# Patient Record
Sex: Female | Born: 1960 | Race: Black or African American | Hispanic: No | Marital: Single | Smoking: Current every day smoker
Health system: Southern US, Community
[De-identification: ages and names within clinical notes are randomized; demographics above are authoritative.]

---

## 2006-07-23 ENCOUNTER — Emergency Department: Payer: Self-pay | Admitting: Emergency Medicine

## 2006-07-23 ENCOUNTER — Other Ambulatory Visit: Payer: Self-pay

## 2006-10-19 ENCOUNTER — Emergency Department: Payer: Self-pay | Admitting: Emergency Medicine

## 2006-10-20 ENCOUNTER — Other Ambulatory Visit: Payer: Self-pay

## 2008-05-01 ENCOUNTER — Emergency Department: Payer: Self-pay | Admitting: Emergency Medicine

## 2008-05-02 ENCOUNTER — Other Ambulatory Visit: Payer: Self-pay

## 2009-07-22 ENCOUNTER — Emergency Department: Payer: Self-pay | Admitting: Emergency Medicine

## 2010-07-03 ENCOUNTER — Emergency Department: Payer: Self-pay | Admitting: Internal Medicine

## 2012-03-05 ENCOUNTER — Emergency Department: Payer: Self-pay | Admitting: Emergency Medicine

## 2012-03-05 LAB — URINALYSIS, COMPLETE
Bilirubin,UR: NEGATIVE
Blood: NEGATIVE
Ketone: NEGATIVE
Ph: 6 (ref 4.5–8.0)
RBC,UR: 2 /HPF (ref 0–5)
Specific Gravity: 1.021 (ref 1.003–1.030)
WBC UR: 2 /HPF (ref 0–5)

## 2012-03-05 LAB — COMPREHENSIVE METABOLIC PANEL
Albumin: 4.1 g/dL (ref 3.4–5.0)
Alkaline Phosphatase: 103 U/L (ref 50–136)
Anion Gap: 9 (ref 7–16)
Bilirubin,Total: 0.3 mg/dL (ref 0.2–1.0)
Chloride: 104 mmol/L (ref 98–107)
Creatinine: 0.75 mg/dL (ref 0.60–1.30)
EGFR (African American): >60
Glucose: 118 mg/dL — ABNORMAL HIGH (ref 65–99)
Osmolality: 281 (ref 275–301)
SGOT(AST): 18 U/L (ref 15–37)
SGPT (ALT): 14 U/L
Sodium: 141 mmol/L (ref 136–145)
Total Protein: 8.9 g/dL — ABNORMAL HIGH (ref 6.4–8.2)

## 2012-03-05 LAB — LIPASE, BLOOD: Lipase: 199 U/L (ref 73–393)

## 2012-03-05 LAB — CBC
HGB: 12.7 g/dL (ref 12.0–16.0)
MCH: 25.6 pg — ABNORMAL LOW (ref 26.0–34.0)
MCV: 79 fL — ABNORMAL LOW (ref 80–100)
Platelet: 310 10*3/uL (ref 150–440)

## 2012-09-22 ENCOUNTER — Emergency Department: Payer: Self-pay | Admitting: Emergency Medicine

## 2012-09-22 LAB — CBC
HGB: 12.6 g/dL (ref 12.0–16.0)
MCH: 24.9 pg — ABNORMAL LOW (ref 26.0–34.0)
MCHC: 32.2 g/dL (ref 32.0–36.0)
MCV: 77 fL — ABNORMAL LOW (ref 80–100)
Platelet: 300 10*3/uL (ref 150–440)
RBC: 5.07 10*6/uL (ref 3.80–5.20)

## 2012-09-22 LAB — COMPREHENSIVE METABOLIC PANEL
Albumin: 4.2 g/dL (ref 3.4–5.0)
Alkaline Phosphatase: 114 U/L (ref 50–136)
BUN: 11 mg/dL (ref 7–18)
Bilirubin,Total: 0.4 mg/dL (ref 0.2–1.0)
Chloride: 106 mmol/L (ref 98–107)
Co2: 25 mmol/L (ref 21–32)
EGFR (Non-African Amer.): >60
Glucose: 127 mg/dL — ABNORMAL HIGH (ref 65–99)
Osmolality: 277 (ref 275–301)
SGOT(AST): 17 U/L (ref 15–37)
SGPT (ALT): 15 U/L (ref 12–78)
Sodium: 138 mmol/L (ref 136–145)
Total Protein: 9.2 g/dL — ABNORMAL HIGH (ref 6.4–8.2)

## 2012-09-22 LAB — URINALYSIS, COMPLETE
Bilirubin,UR: NEGATIVE
Ph: 5 (ref 4.5–8.0)
Squamous Epithelial: 19
WBC UR: 7 /HPF (ref 0–5)

## 2012-09-22 LAB — LIPASE, BLOOD: Lipase: 188 U/L (ref 73–393)

## 2013-01-05 ENCOUNTER — Emergency Department: Payer: Self-pay | Admitting: Emergency Medicine

## 2013-01-05 LAB — CBC WITH DIFFERENTIAL/PLATELET
Basophil #: 0 10*3/uL (ref 0.0–0.1)
Eosinophil %: 0.1 %
HGB: 12.2 g/dL (ref 12.0–16.0)
Lymphocyte %: 13.2 %
MCV: 77 fL — ABNORMAL LOW (ref 80–100)
Monocyte #: 0.3 x10 3/mm (ref 0.2–0.9)
Monocyte %: 2.7 %
Neutrophil #: 7.9 10*3/uL — ABNORMAL HIGH (ref 1.4–6.5)
Neutrophil %: 83.5 %
RDW: 17.1 % — ABNORMAL HIGH (ref 11.5–14.5)
WBC: 9.4 10*3/uL (ref 3.6–11.0)

## 2013-01-05 LAB — COMPREHENSIVE METABOLIC PANEL
Albumin: 4.6 g/dL (ref 3.4–5.0)
Alkaline Phosphatase: 110 U/L (ref 50–136)
BUN: 13 mg/dL (ref 7–18)
Bilirubin,Total: 0.4 mg/dL (ref 0.2–1.0)
Chloride: 105 mmol/L (ref 98–107)
Co2: 27 mmol/L (ref 21–32)
Creatinine: 0.89 mg/dL (ref 0.60–1.30)
EGFR (Non-African Amer.): >60
Glucose: 124 mg/dL — ABNORMAL HIGH (ref 65–99)
SGPT (ALT): 13 U/L (ref 12–78)
Total Protein: 9.7 g/dL — ABNORMAL HIGH (ref 6.4–8.2)

## 2013-02-16 ENCOUNTER — Ambulatory Visit: Payer: Self-pay | Admitting: Unknown Physician Specialty

## 2013-03-16 ENCOUNTER — Emergency Department: Payer: Self-pay | Admitting: Emergency Medicine

## 2013-03-16 LAB — COMPREHENSIVE METABOLIC PANEL
BUN: 10 mg/dL (ref 7–18)
Calcium, Total: 9.3 mg/dL (ref 8.5–10.1)
Co2: 28 mmol/L (ref 21–32)
Creatinine: 0.71 mg/dL (ref 0.60–1.30)
EGFR (Non-African Amer.): >60
Potassium: 3.2 mmol/L — ABNORMAL LOW (ref 3.5–5.1)
SGOT(AST): 21 U/L (ref 15–37)
SGPT (ALT): 13 U/L (ref 12–78)
Sodium: 139 mmol/L (ref 136–145)
Total Protein: 8.8 g/dL — ABNORMAL HIGH (ref 6.4–8.2)

## 2013-03-16 LAB — LIPASE, BLOOD: Lipase: 126 U/L (ref 73–393)

## 2013-03-16 LAB — CBC
HCT: 38.4 % (ref 35.0–47.0)
HGB: 13 g/dL (ref 12.0–16.0)
MCH: 25.6 pg — ABNORMAL LOW (ref 26.0–34.0)
MCV: 76 fL — ABNORMAL LOW (ref 80–100)
Platelet: 336 10*3/uL (ref 150–440)
RBC: 5.07 10*6/uL (ref 3.80–5.20)
RDW: 16.8 % — ABNORMAL HIGH (ref 11.5–14.5)

## 2013-03-16 LAB — URINALYSIS, COMPLETE
Glucose,UR: NEGATIVE mg/dL (ref 0–75)
Ketone: NEGATIVE
Nitrite: NEGATIVE
Protein: NEGATIVE
Specific Gravity: 1.02 (ref 1.003–1.030)
WBC UR: 8 /HPF (ref 0–5)

## 2013-03-16 LAB — PREGNANCY, URINE: Pregnancy Test, Urine: NEGATIVE m[IU]/mL

## 2013-04-23 ENCOUNTER — Ambulatory Visit: Payer: Self-pay | Admitting: Unknown Physician Specialty

## 2013-04-25 LAB — PATHOLOGY REPORT

## 2013-11-20 ENCOUNTER — Emergency Department: Payer: Self-pay | Admitting: Emergency Medicine

## 2013-11-23 ENCOUNTER — Emergency Department: Payer: Self-pay | Admitting: Emergency Medicine

## 2014-07-06 IMAGING — CT CT ABD-PELV W/ CM
1 of 3 series · 14 of 32 positions shown, 19 images · non-contrast
Comparison: none

REASON FOR EXAM: (1) generalized abdominal pain worse RUQ/RLQ vomiting;
(2) generalized abdominal
COMMENTS:

[Series 2: 3mm soft tissue · axial · 0.63mm/px · z∈[-526,-142]mm · 14 of 146 slices shown, 19 images]
[im 9/146  soft-tissue]
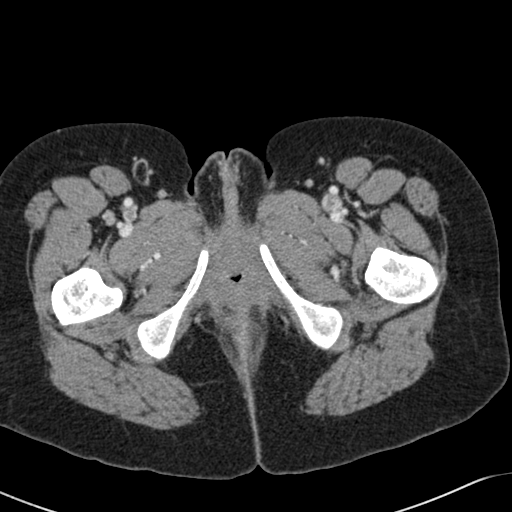
[im 9/146  bone]
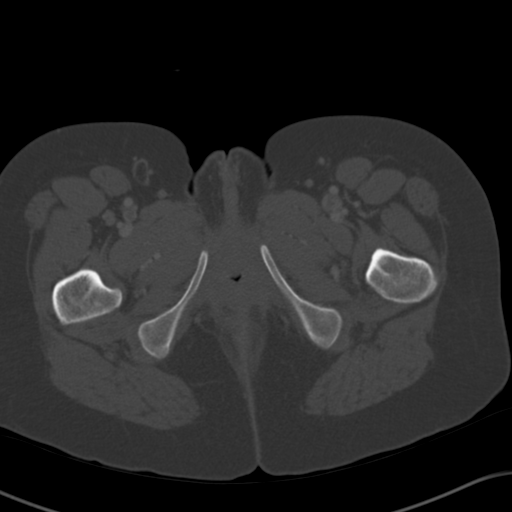
[im 18/146  soft-tissue]
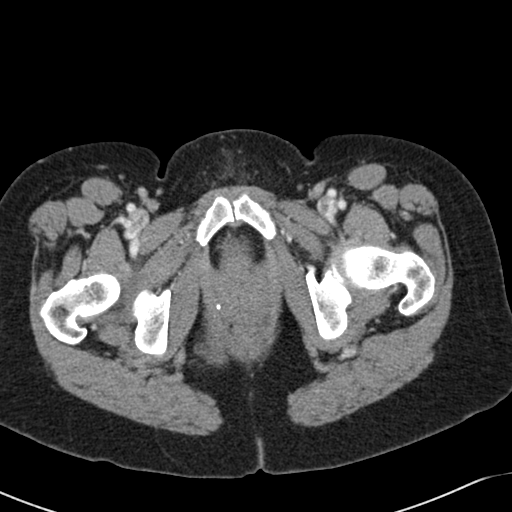
[im 35/146  soft-tissue]
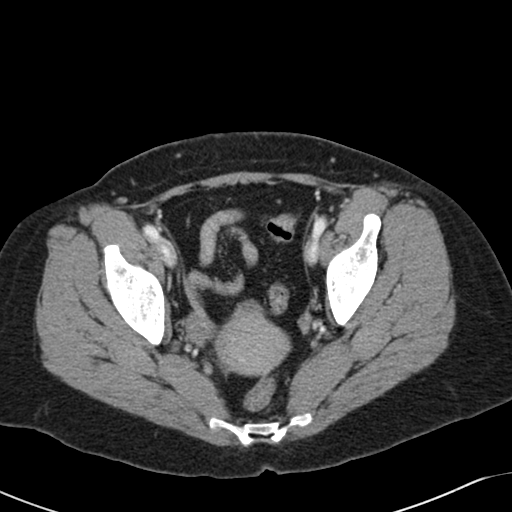
[im 43/146  soft-tissue]
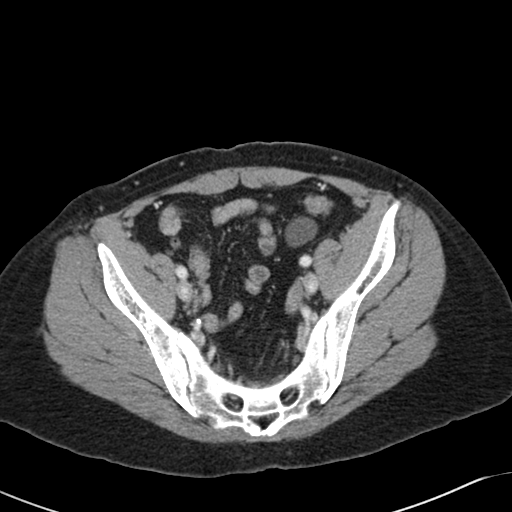
[im 52/146  soft-tissue]
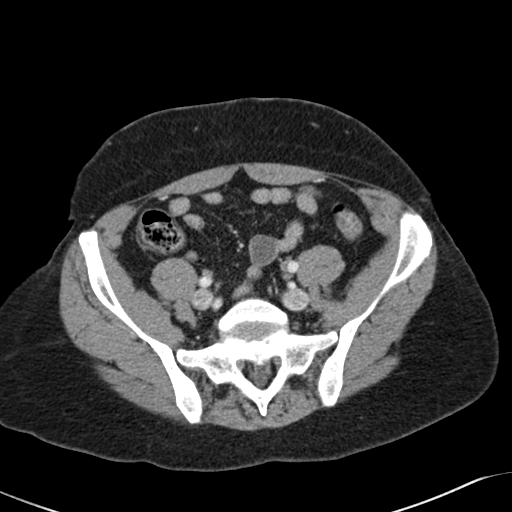
[im 60/146  soft-tissue]
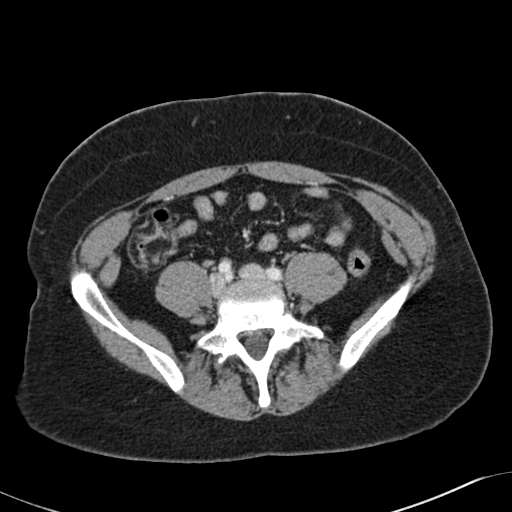
[im 77/146  soft-tissue]
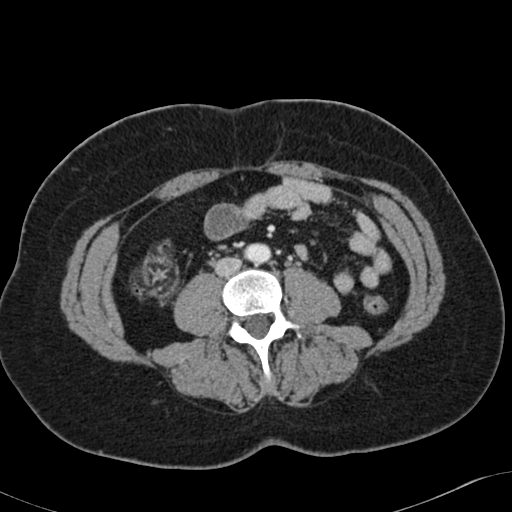
[im 86/146  soft-tissue]
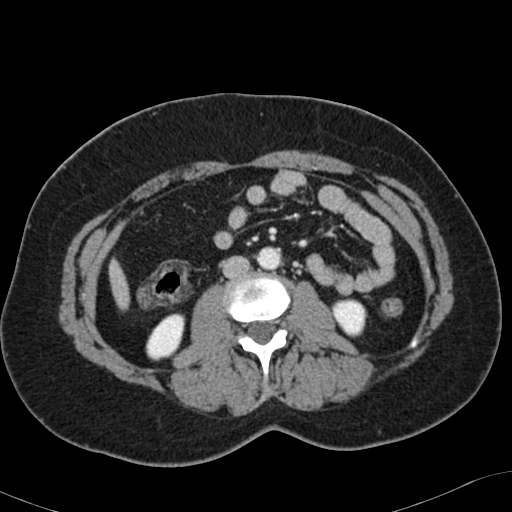
[im 94/146  soft-tissue]
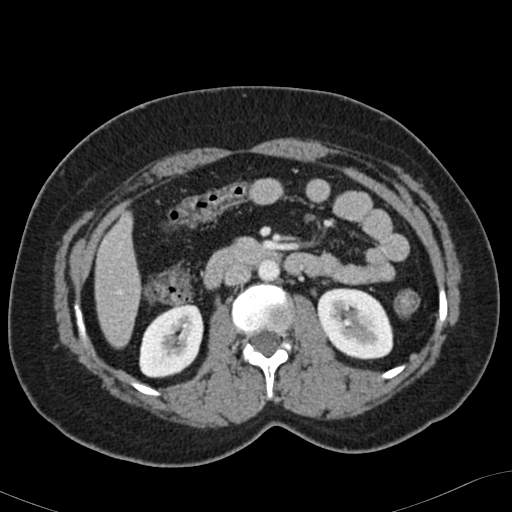
[im 94/146  bone]
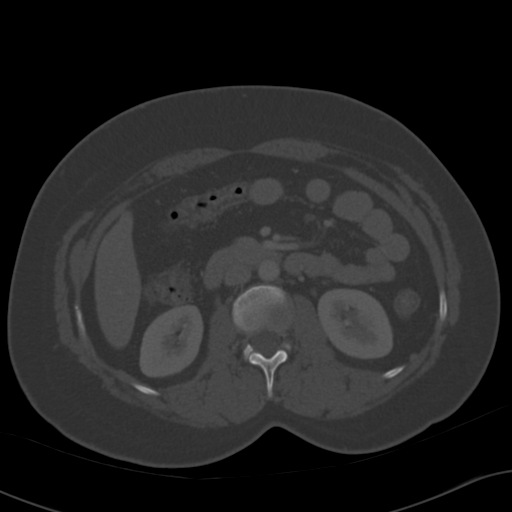
[im 103/146  soft-tissue]
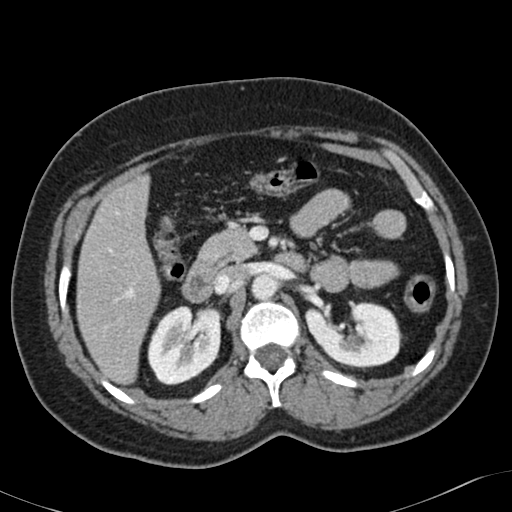
[im 111/146  soft-tissue]
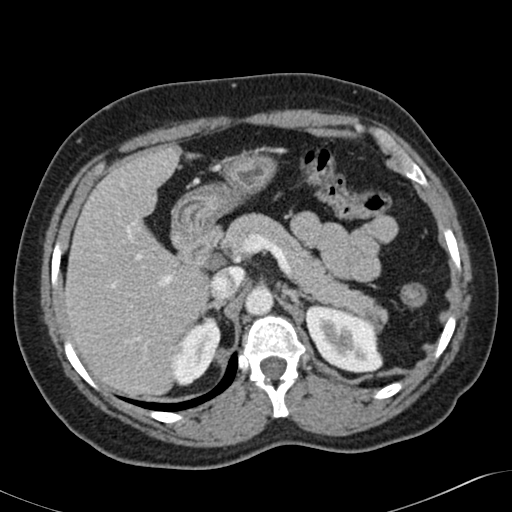
[im 111/146  lung]
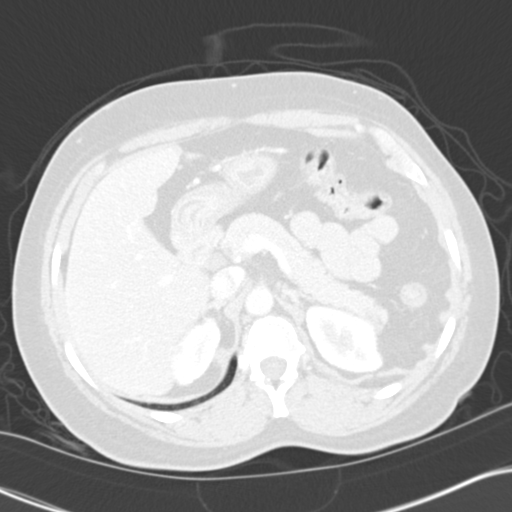
[im 120/146  lung]
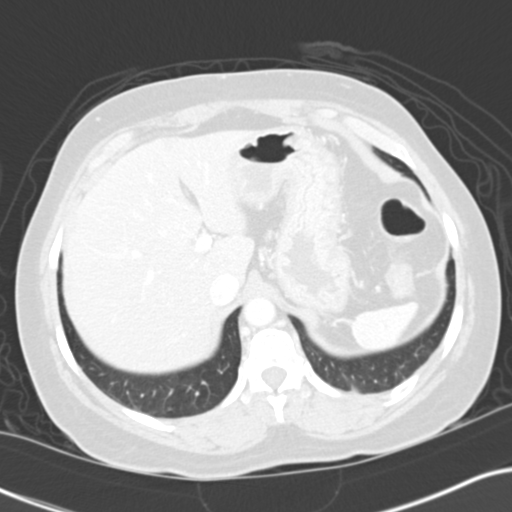
[im 128/146  soft-tissue]
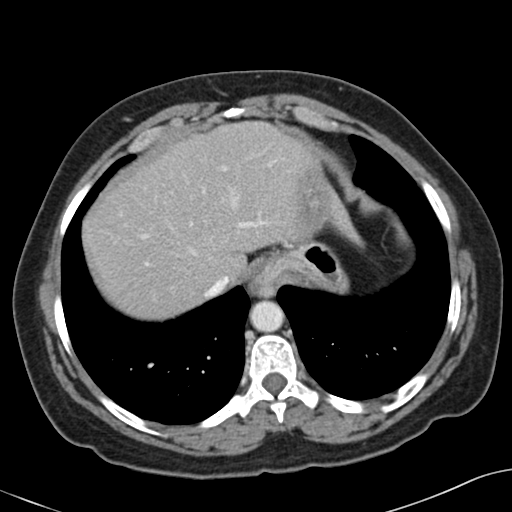
[im 128/146  lung]
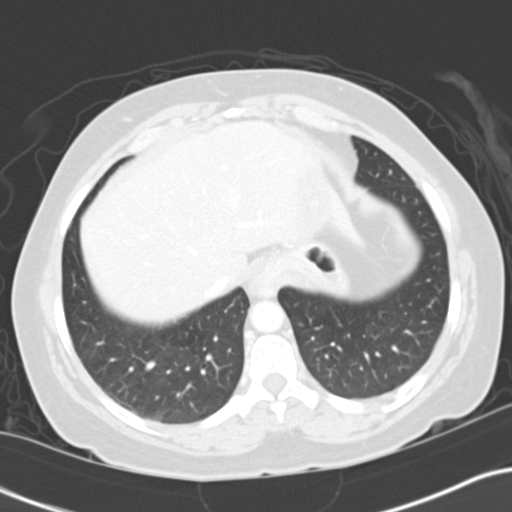
[im 137/146  soft-tissue]
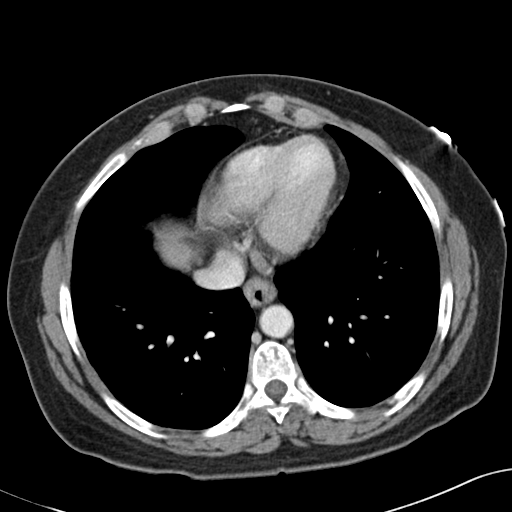
[im 137/146  lung]
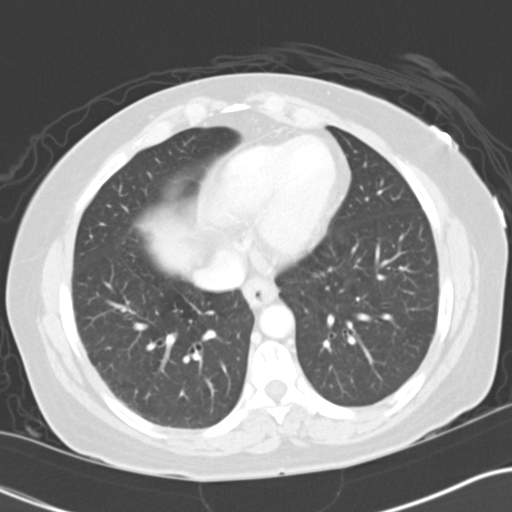

[14 of 32 positions shown; findings below may reference images not displayed]

PROCEDURE:     CT  - CT ABDOMEN / PELVIS  W  - January 05, 2013  [DATE]

RESULT:     Axial CT scanning was performed through the abdomen and pelvis
with reconstructions at 3 mm intervals and slice thicknesses following
intravenous administration of 100 cc of Zsovue-RWW. The patient did not
receive oral contrast material. Review of multiplanar reconstructed images
was performed separately on the VIA monitor.

The liver exhibits no focal mass nor ductal dilation. The gallbladder is
surgically absent. The pancreas, spleen, partially distended stomach,
adrenal glands, and kidneys are normal in appearance. A small hiatal hernia
is present. The caliber of the abdominal aorta is normal.

Unopacified loops of small and large bowel exhibit no evidence of ileus nor
of obstruction. A normal calibered uninflamed appendix is demonstrated on
images 89 through 106. The partially distended urinary bladder is normal in
appearance. The uterus is retroverted. No adnexal masses are demonstrated.
There is no free pelvic fluid. There is no inguinal nor significant
umbilical hernia.

On delayed images contrast within the renal collecting systems appears
normal. The lung bases are clear. The lumbar vertebral bodies are preserved
in height.
IMPRESSION: 1. There is no evidence of acute appendicitis nor bowel obstruction or
ileus. The preliminary reviewer commented upon mild wall thickening of the
ascending and transverse portions of the colon. This is likely due to under
distention. No surrounding inflammatory change is demonstrated. Certainly
low-grade colitis cannot be absolutely excluded.
2. There is no acute hepatobiliary abnormality.
3. No acute urinary tract abnormality is demonstrated.

A preliminary report was sent to the [HOSPITAL] the conclusion
of the study.

[REDACTED]

## 2016-01-06 ENCOUNTER — Emergency Department
Admission: EM | Admit: 2016-01-06 | Discharge: 2016-01-06 | Disposition: A | Discharge status: Home / Self Care | Payer: Self-pay | Attending: Emergency Medicine | Admitting: Emergency Medicine

## 2016-01-06 DIAGNOSIS — K2970 Gastritis, unspecified, without bleeding: Secondary | ICD-10-CM | POA: Insufficient documentation

## 2016-01-06 DIAGNOSIS — Z880 Allergy status to penicillin: Secondary | ICD-10-CM | POA: Insufficient documentation

## 2016-01-06 DIAGNOSIS — F17210 Nicotine dependence, cigarettes, uncomplicated: Secondary | ICD-10-CM | POA: Insufficient documentation

## 2016-01-06 DIAGNOSIS — R1013 Epigastric pain: Secondary | ICD-10-CM

## 2016-01-06 LAB — CBC
HEMATOCRIT: 35.3 % (ref 35.0–47.0)
Hemoglobin: 11.6 g/dL — ABNORMAL LOW (ref 12.0–16.0)
MCH: 23.6 pg — AB (ref 26.0–34.0)
MCHC: 32.9 g/dL (ref 32.0–36.0)
MCV: 71.9 fL — AB (ref 80.0–100.0)
Platelets: 312 10*3/uL (ref 150–440)
RBC: 4.91 MIL/uL (ref 3.80–5.20)
RDW: 19.2 % — AB (ref 11.5–14.5)
WBC: 8.3 10*3/uL (ref 3.6–11.0)

## 2016-01-06 LAB — COMPREHENSIVE METABOLIC PANEL
ALBUMIN: 4 g/dL (ref 3.5–5.0)
ALT: 13 U/L — ABNORMAL LOW (ref 14–54)
AST: 20 U/L (ref 15–41)
Alkaline Phosphatase: 107 U/L (ref 38–126)
Anion gap: 6 (ref 5–15)
BUN: 11 mg/dL (ref 6–20)
CHLORIDE: 104 mmol/L (ref 101–111)
CO2: 28 mmol/L (ref 22–32)
Calcium: 9.2 mg/dL (ref 8.9–10.3)
Creatinine, Ser: 0.87 mg/dL (ref 0.44–1.00)
GFR calc Af Amer: >60 mL/min (ref >60)
GLUCOSE: 123 mg/dL — AB (ref 65–99)
POTASSIUM: 2.9 mmol/L — AB (ref 3.5–5.1)
SODIUM: 138 mmol/L (ref 135–145)
Total Bilirubin: 0.6 mg/dL (ref 0.3–1.2)
Total Protein: 8.2 g/dL — ABNORMAL HIGH (ref 6.5–8.1)

## 2016-01-06 LAB — LIPASE, BLOOD: LIPASE: 23 U/L (ref 11–51)

## 2016-01-06 LAB — TROPONIN I: Troponin I: <0.03 ng/mL (ref <0.031)

## 2016-01-06 MED ORDER — DOCUSATE SODIUM 100 MG PO CAPS: ORAL_CAPSULE | ORAL | Status: AC

## 2016-01-06 MED ORDER — GI COCKTAIL ~~LOC~~
30.0000 mL | Freq: Once | ORAL | Status: AC
  Administered 2016-01-06: 30 mL via ORAL
  Filled 2016-01-06: qty 30

## 2016-01-06 MED ORDER — HYDROCODONE-ACETAMINOPHEN 5-325 MG PO TABS
2.0000 | ORAL_TABLET | Freq: Once | ORAL | Status: AC
  Administered 2016-01-06: 2 via ORAL
  Filled 2016-01-06: qty 2

## 2016-01-06 MED ORDER — ONDANSETRON HCL 4 MG/2ML IJ SOLN
4.0000 mg | Freq: Once | INTRAMUSCULAR | Status: AC
  Administered 2016-01-06: 4 mg via INTRAVENOUS
  Filled 2016-01-06: qty 2

## 2016-01-06 MED ORDER — HYDROCODONE-ACETAMINOPHEN 5-325 MG PO TABS: 1.0000 | ORAL_TABLET | ORAL | Status: AC | PRN

## 2016-01-06 MED ORDER — SUCRALFATE 1 G PO TABS: 1.0000 g | ORAL_TABLET | Freq: Four times a day (QID) | ORAL | Status: AC | PRN

## 2016-01-06 MED ORDER — POTASSIUM CHLORIDE CRYS ER 20 MEQ PO TBCR: 20.0000 meq | EXTENDED_RELEASE_TABLET | Freq: Every day | ORAL | Status: AC

## 2016-01-06 MED ORDER — POTASSIUM CHLORIDE 20 MEQ PO PACK
40.0000 meq | PACK | ORAL | Status: AC
  Administered 2016-01-06: 40 meq via ORAL
  Filled 2016-01-06: qty 2

## 2016-01-06 MED ORDER — PANTOPRAZOLE SODIUM 40 MG IV SOLR
40.0000 mg | Freq: Once | INTRAVENOUS | Status: AC
  Administered 2016-01-06: 40 mg via INTRAVENOUS
  Filled 2016-01-06: qty 40

## 2016-01-06 NOTE — Discharge Instructions (Signed)
As we discussed, your workup was reassuring today.  We believe that you have a recurrence of your gastritis which can happen sometimes when somebody has acid reflux for a long period of time.  We encourage you to take the prescribed medications as written, but also you should start taking over-the-counter acid reflux medication such as Nexium OTC at least once a day.  For the next week you may consider taking Nexium OTC twice daily (morning and evening) to allow your stomach a chance to heal.  Take Norco as prescribed for severe pain. Do not drink alcohol, drive or participate in any other potentially dangerous activities while taking this medication as it may make you sleepy. Do not take this medication with any other sedating medications, either prescription or over-the-counter. If you were prescribed Percocet or Vicodin, do not take these with acetaminophen (Tylenol) as it is already contained within these medications.   This medication is an opiate (or narcotic) pain medication and can be habit forming.  Use it as little as possible to achieve adequate pain control.  Do not use or use it with extreme caution if you have a history of opiate abuse or dependence.  If you are on a pain contract with your primary care doctor or a pain specialist, be sure to let them know you were prescribed this medication today from the [facility].  This medication is intended for your use only - do not give any to anyone else and keep it in a secure place where nobody else, especially children, have access to it.  It will also cause or worsen constipation, so you may want to consider taking an over-the-counter stool softener while you are taking this medication.  Follow up with your regular doctor at the next available opportunity.  You may also consider following up with Dr. Mechele CollinElliott, the GI doctor you saw in 2014 and who did both your prior upper endoscopy and colonoscopy.  Return to the  Emergency Department with new or worsening symptoms that concern you.    Gastritis, Adult Gastritis is soreness and swelling (inflammation) of the lining of the stomach. Gastritis can develop as a sudden onset (acute) or long-term (chronic) condition. If gastritis is not treated, it can lead to stomach bleeding and ulcers. CAUSES  Gastritis occurs when the stomach lining is weak or damaged. Digestive juices from the stomach then inflame the weakened stomach lining. The stomach lining may be weak or damaged due to viral or bacterial infections. One common bacterial infection is the Helicobacter pylori infection. Gastritis can also result from excessive alcohol consumption, taking certain medicines, or having too much acid in the stomach.  SYMPTOMS  In some cases, there are no symptoms. When symptoms are present, they may include:  Pain or a burning sensation in the upper abdomen.  Nausea.  Vomiting.  An uncomfortable feeling of fullness after eating. DIAGNOSIS  Your caregiver may suspect you have gastritis based on your symptoms and a physical exam. To determine the cause of your gastritis, your caregiver may perform the following:  Blood or stool tests to check for the H pylori bacterium.  Gastroscopy. A thin, flexible tube (endoscope) is passed down the esophagus and into the stomach. The endoscope has a light and camera on the end. Your caregiver uses the endoscope to view the inside of the stomach.  Taking a tissue sample (biopsy) from the stomach to examine under a microscope. TREATMENT  Depending on the cause of your gastritis, medicines  may be prescribed. If you have a bacterial infection, such as an H pylori infection, antibiotics may be given. If your gastritis is caused by too much acid in the stomach, H2 blockers or antacids may be given. Your caregiver may recommend that you stop taking aspirin, ibuprofen, or other nonsteroidal anti-inflammatory drugs (NSAIDs). HOME CARE  INSTRUCTIONS  Only take over-the-counter or prescription medicines as directed by your caregiver.  If you were given antibiotic medicines, take them as directed. Finish them even if you start to feel better.  Drink enough fluids to keep your urine clear or pale yellow.  Avoid foods and drinks that make your symptoms worse, such as:  Caffeine or alcoholic drinks.  Chocolate.  Peppermint or mint flavorings.  Garlic and onions.  Spicy foods.  Citrus fruits, such as oranges, lemons, or limes.  Tomato-based foods such as sauce, chili, salsa, and pizza.  Fried and fatty foods.  Eat small, frequent meals instead of large meals. SEEK IMMEDIATE MEDICAL CARE IF:   You have black or dark red stools.  You vomit blood or material that looks like coffee grounds.  You are unable to keep fluids down.  Your abdominal pain gets worse.  You have a fever.  You do not feel better after 1 week.  You have any other questions or concerns. MAKE SURE YOU:  Understand these instructions.  Will watch your condition.  Will get help right away if you are not doing well or get worse.   This information is not intended to replace advice given to you by your health care provider. Make sure you discuss any questions you have with your health care provider.   Document Released: 09/28/2001 Document Revised: 04/04/2012 Document Reviewed: 11/17/2011 Elsevier Interactive Patient Education 2016 [employer]  Abdominal Pain, Adult Many things can cause abdominal pain. Usually, abdominal pain is not caused by a disease and will improve without treatment. It can often be observed and treated at home. Your health care provider will do a physical exam and possibly order blood tests and X-rays to help determine the seriousness of your pain. However, in many cases, more time must pass before a clear cause of the pain can be found. Before that point, your health care provider may not know if you need more  testing or further treatment. HOME CARE INSTRUCTIONS Monitor your abdominal pain for any changes. The following actions may help to alleviate any discomfort you are experiencing:  Only take over-the-counter or prescription medicines as directed by your health care provider.  Do not take laxatives unless directed to do so by your health care provider.  Try a clear liquid diet (broth, tea, or water) as directed by your health care provider. Slowly move to a bland diet as tolerated. SEEK MEDICAL CARE IF:  You have unexplained abdominal pain.  You have abdominal pain associated with nausea or diarrhea.  You have pain when you urinate or have a bowel movement.  You experience abdominal pain that wakes you in the night.  You have abdominal pain that is worsened or improved by eating food.  You have abdominal pain that is worsened with eating fatty foods.  You have a fever. SEEK IMMEDIATE MEDICAL CARE IF:  Your pain does not go away within 2 hours.  You keep throwing up (vomiting).  Your pain is felt only in portions of the abdomen, such as the right side or the left lower portion of the abdomen.  You pass bloody or black tarry stools. MAKE  SURE YOU:  Understand these instructions.  Will watch your condition.  Will get help right away if you are not doing well or get worse.   This information is not intended to replace advice given to you by your health care provider. Make sure you discuss any questions you have with your health care provider.   Document Released: 07/14/2005 Document Revised: 06/25/2015 Document Reviewed: 06/13/2013 Elsevier Interactive Patient Education [employer]

## 2016-01-06 NOTE — ED Notes (Signed)
Pt bib EMS w/ c/o upper abd pain.  Per EMS, pt has been brought in for this pain before.  Pt sts last time she was here"they shine a light down my stomach".  A/O x 4. Pt describes pain as "bunching".  Pt sts she emesis x 4 this AM.  Denies CP, SOB, fever or LOC.  NAD

## 2016-01-06 NOTE — ED Provider Notes (Signed)
[facility] Emergency Department Provider Note  ____________________________________________  Time seen: Approximately 12:36 PM  I have reviewed the triage vital signs and the nursing notes.   HISTORY  Chief Complaint Abdominal Pain    HPI Brittany Stephenson is a 55 y.o. female with a PMH that includes cholecystectomy >30 years ago and chronic GERD and an episode of gastritis/duodenitis diagnosed by upper endoscopy in 2014 by Dr. Mechele Collin who presents with gradual onset severe epigastric pressure and sharp pain.  She reports that she felt okay when she went to sleep last night and then got up at about 3 AM and felt a little bit of discomfort in her epigastrium which gradually got worse and has been persistent since that time.  She has some nausea and had 4 episodes of vomiting earlier this morning.  It feels better at this time but is still persistent.  Nothing is made it better and nothing is made it worse.  She denies chest pain, lower abdominal pain, dysuria, fever/chills, shortness of breath.  She has not had any recent diarrhea.   History reviewed. No pertinent past medical history.  There are no active problems to display for this patient.   History reviewed. No pertinent past surgical history.  Current Outpatient Rx  Name  Route  Sig  Dispense  Refill  .           .           .           .             Allergies Penicillins  No family history on file.  Social History Social History  Substance Use Topics  . Smoking status: Current Every Day Smoker -- 0.50 packs/day    Types: Cigarettes  . Smokeless tobacco: None  . Alcohol Use: None    Review of Systems Constitutional: No fever/chills Eyes: No visual changes. ENT: No sore throat. Cardiovascular: Denies chest pain. Respiratory: Denies shortness of breath. Gastrointestinal: Epigastric "bunching", pressure, sharp pain.  Several episodes of nausea and vomiting Genitourinary: Negative for  dysuria. Musculoskeletal: Negative for back pain. Skin: Negative for rash. Neurological: Negative for headaches, focal weakness or numbness.  10-point ROS otherwise negative.  ____________________________________________   PHYSICAL EXAM:  VITAL SIGNS: ED Triage Vitals  Enc Vitals Group     BP 01/06/16 1214 121/88 mmHg     Pulse Rate 01/06/16 1214 57     Resp 01/06/16 1214 18     Temp 01/06/16 1214 97.8 F (36.6 C)     Temp Source 01/06/16 1214 Oral     SpO2 01/06/16 1214 99 %     Weight 01/06/16 1214 160 lb (72.576 kg)     Height 01/06/16 1214 5' (1.524 m)     Head Cir --      Peak Flow --      Pain Score 01/06/16 1215 10     Pain Loc --      Pain Edu? --      Excl. in GC? --     Constitutional: Alert and oriented. Well appearing and in no acute distress.  Busy texting on her phone when I came to examine her Eyes: Conjunctivae are normal. PERRL. EOMI. Head: Atraumatic. Nose: No congestion/rhinnorhea. Mouth/Throat: Mucous membranes are moist.  Oropharynx non-erythematous. Neck: No stridor.  No meningeal signs.   Cardiovascular: Normal rate, regular rhythm. Good peripheral circulation. Grossly normal heart sounds.   Respiratory: Normal respiratory effort.  No  retractions. Lungs CTAB. Gastrointestinal: Soft with mild epigastric tenderness to palpation Musculoskeletal: No lower extremity tenderness nor edema. No gross deformities of extremities. Neurologic:  Normal speech and language. No gross focal neurologic deficits are appreciated.  Skin:  Skin is warm, dry and intact. No rash noted.   ____________________________________________   LABS (all labs ordered are listed, but only abnormal results are displayed)  Labs Reviewed  COMPREHENSIVE METABOLIC PANEL - Abnormal; Notable for the following:    Potassium 2.9 (*)    Glucose, Bld 123 (*)    Total Protein 8.2 (*)    ALT 13 (*)    All other components within normal limits  CBC - Abnormal; Notable for the  following:    Hemoglobin 11.6 (*)    MCV 71.9 (*)    MCH 23.6 (*)    RDW 19.2 (*)    All other components within normal limits  LIPASE, BLOOD  TROPONIN I   ____________________________________________  EKG  ED ECG REPORT I, Carlynn Leduc, the attending physician, personally viewed and interpreted this ECG.  Date: 01/06/2016 EKG Time: 12:21 Rate: 61 Rhythm: normal sinus rhythm QRS Axis: normal Intervals: normal ST/T Wave abnormalities: normal Conduction Disturbances: none Narrative Interpretation: unremarkable  ____________________________________________  RADIOLOGY   No results found.  ____________________________________________   PROCEDURES  Procedure(s) performed: None  Critical Care performed: No ____________________________________________   INITIAL IMPRESSION / ASSESSMENT AND PLAN / ED COURSE  Pertinent labs & imaging results that were available during my care of the patient were reviewed by me and considered in my medical decision making (see chart for details).  Though the patient does use tobacco, have a very low suspicion that her current epigastric symptoms represent a cardiac issue.  I will go ahead and add on a troponin given that this may be an atypical presentation but her EKG is reassuring.  I reviewed past medical records in [place] and found the upper endoscopy report from Dr. Mechele Collin that diagnosed gastritis and duodenitis.  I asked if the patient still takes PPIs regularly, and she states that she takes Nexium "about every other day".  It may be that she is adequately treated for her acid reflux and she is having a flare up of gastritis at this time.  Overall her physical exam is reassuring and her vital signs are normal.  I will treat her with a GI cocktail and I anticipate discharge with outpatient follow-up with normal gastritis treatment which will include PPIs and sucralfate.  ----------------------------------------- 1:38 PM on  01/06/2016 -----------------------------------------  Troponin is negative and labs are all reassuring except for hypokalemia likely due to GI loss.  She has had no other vomiting in the emergency department and does continue to complain of severe pain although she is in no acute distress and continued to talk with multiple family members and text without any apparent discomfort.  I had another lengthy discussion with the patient about the management of her symptoms.  We discussed recurrence of her gastritis and she felt that if she had something for her pain that she would be able to follow up as an outpatient.  I gave her dose of Protonix 40 mg IV and Zofran which should help with nausea because I am also giving her 40 mEq of potassium chloride by mouth.  I will give her 2 Norco and a prescription for a few tablets, but I explained this is only to get her through the initial discomfort as her gastritis is improving.  I am discharging  her with a prescription for sucralfate and instructions to take Nexium twice daily and for her to follow up with her primary care doctor at the next available opportunity.  I gave my usual and customary return precautions.  She continues to have no lower abdominal tenderness nor distention and has only mild tenderness to palpation of the epigastrium.  She has had a prior cholecystectomy and I do not believe that an ultrasound would be useful.  I also do not believe based on her workup today that a CT scan is indicated.   ____________________________________________  FINAL CLINICAL IMPRESSION(S) / ED DIAGNOSES  Final diagnoses:  Gastritis  Epigastric abdominal pain      NEW MEDICATIONS STARTED DURING THIS VISIT:  New Prescriptions   DOCUSATE SODIUM (COLACE) 100 MG CAPSULE    Take 1 tablet once or twice daily as needed for constipation while taking narcotic pain medicine   HYDROCODONE-ACETAMINOPHEN (NORCO/VICODIN) 5-325 MG TABLET    Take 1-2 tablets by mouth every  4 (four) hours as needed for moderate pain.   POTASSIUM CHLORIDE SA (KLOR-CON M20) 20 MEQ TABLET    Take 1 tablet (20 mEq total) by mouth daily.   SUCRALFATE (CARAFATE) 1 G TABLET    Take 1 tablet (1 g total) by mouth 4 (four) times daily as needed (for abdominal discomfort, nausea, and/or vomiting).      Note:  This document was prepared using Dragon voice recognition software and may include unintentional dictation errors.   Loleta Roseory Cimberly Stoffel, MD 01/06/16 1349

## 2019-04-29 ENCOUNTER — Inpatient Hospital Stay
Admission: EM | Admit: 2019-04-29 | Discharge: 2019-04-30 | DRG: 390 | Disposition: A | Discharge status: Home / Self Care | Payer: Self-pay | Attending: Surgery | Admitting: Surgery

## 2019-04-29 ENCOUNTER — Emergency Department: Payer: Self-pay

## 2019-04-29 ENCOUNTER — Other Ambulatory Visit: Payer: Self-pay

## 2019-04-29 DIAGNOSIS — K56600 Partial intestinal obstruction, unspecified as to cause: Principal | ICD-10-CM | POA: Diagnosis present

## 2019-04-29 DIAGNOSIS — F17210 Nicotine dependence, cigarettes, uncomplicated: Secondary | ICD-10-CM | POA: Diagnosis present

## 2019-04-29 DIAGNOSIS — E860 Dehydration: Secondary | ICD-10-CM | POA: Diagnosis present

## 2019-04-29 DIAGNOSIS — K567 Ileus, unspecified: Secondary | ICD-10-CM

## 2019-04-29 DIAGNOSIS — Z1159 Encounter for screening for other viral diseases: Secondary | ICD-10-CM

## 2019-04-29 DIAGNOSIS — K56609 Unspecified intestinal obstruction, unspecified as to partial versus complete obstruction: Secondary | ICD-10-CM

## 2019-04-29 DIAGNOSIS — E876 Hypokalemia: Secondary | ICD-10-CM | POA: Diagnosis present

## 2019-04-29 DIAGNOSIS — Z79899 Other long term (current) drug therapy: Secondary | ICD-10-CM

## 2019-04-29 LAB — CBC WITH DIFFERENTIAL/PLATELET
Abs Immature Granulocytes: 0.04 10*3/uL (ref 0.00–0.07)
Basophils Absolute: 0 10*3/uL (ref 0.0–0.1)
Basophils Relative: 0 %
Eosinophils Absolute: 0 10*3/uL (ref 0.0–0.5)
Eosinophils Relative: 0 %
HCT: 37 % (ref 36.0–46.0)
Hemoglobin: 12.1 g/dL (ref 12.0–15.0)
Immature Granulocytes: 0 %
Lymphocytes Relative: 15 %
Lymphs Abs: 1.4 10*3/uL (ref 0.7–4.0)
MCH: 24.3 pg — ABNORMAL LOW (ref 26.0–34.0)
MCHC: 32.7 g/dL (ref 30.0–36.0)
MCV: 74.3 fL — ABNORMAL LOW (ref 80.0–100.0)
Monocytes Absolute: 0.3 10*3/uL (ref 0.1–1.0)
Monocytes Relative: 4 %
Neutro Abs: 7.2 10*3/uL (ref 1.7–7.7)
Neutrophils Relative %: 81 %
Platelets: 341 10*3/uL (ref 150–400)
RBC: 4.98 MIL/uL (ref 3.87–5.11)
RDW: 17 % — ABNORMAL HIGH (ref 11.5–15.5)
WBC: 9 10*3/uL (ref 4.0–10.5)
nRBC: 0 % (ref 0.0–0.2)

## 2019-04-29 LAB — MAGNESIUM: Magnesium: 2.2 mg/dL (ref 1.7–2.4)

## 2019-04-29 LAB — COMPREHENSIVE METABOLIC PANEL
ALT: 12 U/L (ref 0–44)
AST: 22 U/L (ref 15–41)
Albumin: 4.5 g/dL (ref 3.5–5.0)
Alkaline Phosphatase: 91 U/L (ref 38–126)
Anion gap: 14 (ref 5–15)
BUN: 18 mg/dL (ref 6–20)
CO2: 28 mmol/L (ref 22–32)
Calcium: 9.7 mg/dL (ref 8.9–10.3)
Chloride: 101 mmol/L (ref 98–111)
Creatinine, Ser: 0.94 mg/dL (ref 0.44–1.00)
GFR calc Af Amer: >60 mL/min (ref >60)
GFR calc non Af Amer: >60 mL/min (ref >60)
Glucose, Bld: 132 mg/dL — ABNORMAL HIGH (ref 70–99)
Potassium: 2.8 mmol/L — ABNORMAL LOW (ref 3.5–5.1)
Sodium: 143 mmol/L (ref 135–145)
Total Bilirubin: 0.7 mg/dL (ref 0.3–1.2)
Total Protein: 8.8 g/dL — ABNORMAL HIGH (ref 6.5–8.1)

## 2019-04-29 LAB — URINALYSIS, COMPLETE (UACMP) WITH MICROSCOPIC
Bacteria, UA: NONE SEEN
Bilirubin Urine: NEGATIVE
Glucose, UA: NEGATIVE mg/dL
Ketones, ur: NEGATIVE mg/dL
Leukocytes,Ua: NEGATIVE
Nitrite: NEGATIVE
Protein, ur: 30 mg/dL — AB
Specific Gravity, Urine: >1.046 — ABNORMAL HIGH (ref 1.005–1.030)
pH: 6 (ref 5.0–8.0)

## 2019-04-29 LAB — LIPASE, BLOOD: Lipase: 21 U/L (ref 11–51)

## 2019-04-29 LAB — POTASSIUM: Potassium: 2.8 mmol/L — ABNORMAL LOW (ref 3.5–5.1)

## 2019-04-29 LAB — SARS CORONAVIRUS 2 BY RT PCR (HOSPITAL ORDER, PERFORMED IN ~~LOC~~ HOSPITAL LAB): SARS Coronavirus 2: NEGATIVE

## 2019-04-29 MED ORDER — SODIUM CHLORIDE 0.9 % IV BOLUS
1000.0000 mL | Freq: Once | INTRAVENOUS | Status: AC
  Administered 2019-04-29: 1000 mL via INTRAVENOUS

## 2019-04-29 MED ORDER — MORPHINE SULFATE (PF) 4 MG/ML IV SOLN
4.0000 mg | Freq: Once | INTRAVENOUS | Status: AC
  Administered 2019-04-29: 4 mg via INTRAVENOUS
  Filled 2019-04-29: qty 1

## 2019-04-29 MED ORDER — ONDANSETRON HCL 4 MG/2ML IJ SOLN
4.0000 mg | Freq: Once | INTRAMUSCULAR | Status: AC
  Administered 2019-04-29: 4 mg via INTRAVENOUS
  Filled 2019-04-29: qty 2

## 2019-04-29 MED ORDER — ALUM & MAG HYDROXIDE-SIMETH 200-200-20 MG/5ML PO SUSP
30.0000 mL | Freq: Once | ORAL | Status: AC
  Administered 2019-04-29: 30 mL via ORAL
  Filled 2019-04-29: qty 30

## 2019-04-29 MED ORDER — PANTOPRAZOLE SODIUM 40 MG IV SOLR
40.0000 mg | Freq: Two times a day (BID) | INTRAVENOUS | Status: DC
  Administered 2019-04-29 – 2019-04-30 (×2): 40 mg via INTRAVENOUS
  Filled 2019-04-29 (×3): qty 40

## 2019-04-29 MED ORDER — ONDANSETRON 4 MG PO TBDP: 4.0000 mg | ORAL_TABLET | Freq: Four times a day (QID) | ORAL | Status: DC | PRN

## 2019-04-29 MED ORDER — IOHEXOL 300 MG/ML  SOLN
100.0000 mL | Freq: Once | INTRAMUSCULAR | Status: AC | PRN
  Administered 2019-04-29: 100 mL via INTRAVENOUS

## 2019-04-29 MED ORDER — ENOXAPARIN SODIUM 40 MG/0.4ML ~~LOC~~ SOLN
40.0000 mg | SUBCUTANEOUS | Status: DC
  Administered 2019-04-29: 40 mg via SUBCUTANEOUS
  Filled 2019-04-29: qty 0.4

## 2019-04-29 MED ORDER — LIDOCAINE VISCOUS HCL 2 % MT SOLN
15.0000 mL | Freq: Once | OROMUCOSAL | Status: AC
  Administered 2019-04-29: 15 mL via ORAL
  Filled 2019-04-29: qty 15

## 2019-04-29 MED ORDER — POTASSIUM CHLORIDE 10 MEQ/100ML IV SOLN
10.0000 meq | INTRAVENOUS | Status: AC
  Administered 2019-04-29 (×3): 10 meq via INTRAVENOUS
  Filled 2019-04-29 (×3): qty 100

## 2019-04-29 MED ORDER — MORPHINE SULFATE (PF) 2 MG/ML IV SOLN: 2.0000 mg | INTRAVENOUS | Status: DC | PRN

## 2019-04-29 MED ORDER — KETOROLAC TROMETHAMINE 30 MG/ML IJ SOLN: 30.0000 mg | Freq: Four times a day (QID) | INTRAMUSCULAR | Status: DC | PRN

## 2019-04-29 MED ORDER — KCL IN DEXTROSE-NACL 40-5-0.9 MEQ/L-%-% IV SOLN
INTRAVENOUS | Status: DC
  Administered 2019-04-29 – 2019-04-30 (×3): via INTRAVENOUS
  Filled 2019-04-29 (×5): qty 1000

## 2019-04-29 MED ORDER — ONDANSETRON HCL 4 MG/2ML IJ SOLN: 4.0000 mg | Freq: Four times a day (QID) | INTRAMUSCULAR | Status: DC | PRN

## 2019-04-29 MED ORDER — PANTOPRAZOLE SODIUM 40 MG IV SOLR
40.0000 mg | Freq: Once | INTRAVENOUS | Status: AC
  Administered 2019-04-29: 40 mg via INTRAVENOUS
  Filled 2019-04-29: qty 40

## 2019-04-29 MED ORDER — PROCHLORPERAZINE EDISYLATE 10 MG/2ML IJ SOLN
5.0000 mg | Freq: Four times a day (QID) | INTRAMUSCULAR | Status: DC | PRN
  Filled 2019-04-29: qty 2

## 2019-04-29 MED ORDER — HYDRALAZINE HCL 20 MG/ML IJ SOLN: 10.0000 mg | INTRAMUSCULAR | Status: DC | PRN

## 2019-04-29 MED ORDER — POTASSIUM CHLORIDE 10 MEQ/100ML IV SOLN: 10.0000 meq | INTRAVENOUS | Status: DC

## 2019-04-29 MED ORDER — PROCHLORPERAZINE MALEATE 10 MG PO TABS
10.0000 mg | ORAL_TABLET | Freq: Four times a day (QID) | ORAL | Status: DC | PRN
  Filled 2019-04-29: qty 1

## 2019-04-29 NOTE — ED Notes (Signed)
Pt up to toilet 

## 2019-04-29 NOTE — ED Triage Notes (Signed)
Pt presents via EMS c/o RUQ pain x2 days. EMS reports worse with laying down. Reports hx cholecystectomy. Denies N/V.

## 2019-04-29 NOTE — ED Notes (Signed)
ED TO INPATIENT HANDOFF REPORT  ED Nurse Name and Phone #: Helene Kelp [phone]  S Name/Age/Gender Brittany Stephenson 58 y.o. female Room/Bed: ED10A/ED10A  Code Status   Code Status: Not on file  Home/SNF/Other Home Patient oriented to: self, place, time and situation Is this baseline? Yes   Triage Complete: Triage complete  Chief Complaint abdominal pain   Triage Note Pt presents via EMS c/o RUQ pain x2 days. EMS reports worse with laying down. Reports hx cholecystectomy. Denies N/V.    Allergies Allergies  Allergen Reactions  . Penicillins Hives    Level of Care/Admitting Diagnosis ED Disposition    ED Disposition Condition [facility]: [place] [[id number]]  Level of Care: Med-Surg [16]  Covid Evaluation: Asymptomatic Screening Protocol (No Symptoms)  Diagnosis: SBO (small bowel obstruction) [facility]) [[id number]]  Admitting Physician: Jules Husbands [[id number]]  Attending Physician: Jules Husbands [[id number]]  Estimated length of stay: 3 - 4 days  Certification:: I certify this patient will need inpatient services for at least 2 midnights  PT Class (Do Not Modify): Inpatient [101]  PT Acc Code (Do Not Modify): Private [1]       B Medical/Surgery History History reviewed. No pertinent past medical history. No past surgical history on file.   A IV Location/Drains/Wounds Patient Lines/Drains/Airways Status   Active Line/Drains/Airways    Name:   Placement date:   Placement time:   Site:   Days:   Peripheral IV 04/29/19 Left Antecubital   04/29/19    0940    Antecubital   less than 1          Intake/Output Last 24 hours  Intake/Output Summary (Last 24 hours) at 04/29/2019 1251 Last data filed at 04/29/2019 1221 Gross per 24 hour  Intake 1100 ml  Output -  Net 1100 ml    Labs/Imaging Results for orders placed or performed during the hospital encounter of 04/29/19 (from the past 48 hour(s))  CBC with Differential     Status: Abnormal    Collection Time: 04/29/19  8:29 AM  Result Value Ref Range   WBC 9.0 4.0 - 10.5 K/uL   RBC 4.98 3.87 - 5.11 MIL/uL   Hemoglobin 12.1 12.0 - 15.0 g/dL   HCT 37.0 36.0 - 46.0 %   MCV 74.3 (L) 80.0 - 100.0 fL   MCH 24.3 (L) 26.0 - 34.0 pg   MCHC 32.7 30.0 - 36.0 g/dL   RDW 17.0 (H) 11.5 - 15.5 %   Platelets 341 150 - 400 K/uL   nRBC 0.0 0.0 - 0.2 %   Neutrophils Relative % 81 %   Neutro Abs 7.2 1.7 - 7.7 K/uL   Lymphocytes Relative 15 %   Lymphs Abs 1.4 0.7 - 4.0 K/uL   Monocytes Relative 4 %   Monocytes Absolute 0.3 0.1 - 1.0 K/uL   Eosinophils Relative 0 %   Eosinophils Absolute 0.0 0.0 - 0.5 K/uL   Basophils Relative 0 %   Basophils Absolute 0.0 0.0 - 0.1 K/uL   Immature Granulocytes 0 %   Abs Immature Granulocytes 0.04 0.00 - 0.07 K/uL    Comment: Performed at [facility], [address] [place], [place] [address]  Comprehensive metabolic panel     Status: Abnormal   Collection Time: 04/29/19  8:29 AM  Result Value Ref Range   Sodium 143 135 - 145 mmol/L   Potassium 2.8 (L) 3.5 - 5.1 mmol/L   Chloride 101 98 -  111 mmol/L   CO2 28 22 - 32 mmol/L   Glucose, Bld 132 (H) 70 - 99 mg/dL   BUN 18 6 - 20 mg/dL   Creatinine, Ser 1.610.94 0.44 - 1.00 mg/dL   Calcium 9.7 8.9 - 09.610.3 mg/dL   Total Protein 8.8 (H) 6.5 - 8.1 g/dL   Albumin 4.5 3.5 - 5.0 g/dL   AST 22 15 - 41 U/L   ALT 12 0 - 44 U/L   Alkaline Phosphatase 91 38 - 126 U/L   Total Bilirubin 0.7 0.3 - 1.2 mg/dL   GFR calc non Af Amer >60 >60 mL/min   GFR calc Af Amer >60 >60 mL/min   Anion gap 14 5 - 15    Comment: Performed at [facility], [address] [place], [place] [address]  Lipase, blood     Status: None   Collection Time: 04/29/19  8:29 AM  Result Value Ref Range   Lipase 21 11 - 51 U/L    Comment: Performed at [facility], [address] [place], [place] [address]  Magnesium     Status: None   Collection Time: 04/29/19  8:29 AM  Result Value Ref Range   Magnesium 2.2  1.7 - 2.4 mg/dL    Comment: Performed at [facility], [address] [place], [place] [address]  Urinalysis, Complete w Microscopic     Status: Abnormal   Collection Time: 04/29/19 12:05 PM  Result Value Ref Range   Color, Urine YELLOW (A) YELLOW   APPearance CLEAR (A) CLEAR   Specific Gravity, Urine >1.046 (H) 1.005 - 1.030   pH 6.0 5.0 - 8.0   Glucose, UA NEGATIVE NEGATIVE mg/dL   Hgb urine dipstick SMALL (A) NEGATIVE   Bilirubin Urine NEGATIVE NEGATIVE   Ketones, ur NEGATIVE NEGATIVE mg/dL   Protein, ur 30 (A) NEGATIVE mg/dL   Nitrite NEGATIVE NEGATIVE   Leukocytes,Ua NEGATIVE NEGATIVE   RBC / HPF 6-10 0 - 5 RBC/hpf   WBC, UA 0-5 0 - 5 WBC/hpf   Bacteria, UA NONE SEEN NONE SEEN   Squamous Epithelial / LPF 0-5 0 - 5   Mucus PRESENT     Comment: Performed at [facility], [address] [place], [place] [address]   Ct Abdomen Pelvis W Contrast  Result Date: 04/29/2019 CLINICAL DATA:  Abdominal pain with nausea and vomiting EXAM: CT ABDOMEN AND PELVIS WITH CONTRAST TECHNIQUE: Multidetector CT imaging of the abdomen and pelvis was performed using the standard protocol following bolus administration of intravenous contrast. CONTRAST:  100mL OMNIPAQUE IOHEXOL 300 MG/ML  SOLN COMPARISON:  January 05, 2013 FINDINGS: Lower chest: There is no appreciable lung base edema or consolidation. There is a small hiatal hernia. Hepatobiliary: There is hepatic steatosis with hepatic steatosis somewhat more severe near the fissure for the ligamentum teres. No focal liver lesions are appreciable. Gallbladder is absent. There is no appreciable biliary duct dilatation. Pancreas: There is no pancreatic mass or inflammatory focus. Spleen: The spleen is rather small without focal splenic lesion evident. Adrenals/Urinary Tract: Adrenals bilaterally appear normal. There is a 7 mm cyst in the posterior upper left kidney. There is no evident renal hydronephrosis on either side. There is no renal or  ureteral calculus on either side. Urinary bladder is midline. Urinary bladder wall is mildly thickened. Stomach/Bowel: Stomach is normal in size. There is dilatation loops of proximal jejunum with an apparent transition zone near the junction of the proximal and mid jejunum in the  left upper quadrant consistent with a degree of proximal small-bowel obstruction. Bowel loops more distally appear normal. Terminal ileum appears normal. No free air or portal venous air is evident. Vascular/Lymphatic: There is no abdominal aortic aneurysm. There is aortic and iliac artery atherosclerotic calcification. No adenopathy is evident in the abdomen or pelvis. Reproductive: Uterus is retroverted.  No pelvic mass evident. Other: The appendix appears normal. There is no abscess or ascites in the abdomen or pelvis. Musculoskeletal: There are foci of degenerative change in the lumbar spine. There are no blastic or lytic bone lesions. IMPRESSION: 1. There is a degree of proximal small bowel obstruction with transition zone in the left upper abdomen near the junction of the proximal and mid thirds of the jejunum. Bowel elsewhere appears unremarkable. 2. Urinary bladder wall thickening consistent with a degree of cystitis. No renal or ureteral calculus. No hydronephrosis on either side. 3.  Small hiatal hernia. 4. Hepatic steatosis. Gallbladder absent. No biliary duct dilatation evident. 5.  Aortic and iliac artery atherosclerosis. Electronically Signed   By: Bretta BangWilliam  Woodruff III M.D.   On: 04/29/2019 10:15    Pending Labs Unresulted Labs (From admission, onward)    Start     Ordered   04/29/19 1800  Potassium  Once-Timed,   STAT     04/29/19 1217   04/29/19 1159  SARS Coronavirus 2 (CEPHEID - Performed in [facility] hospital lab), Hosp Order  (Asymptomatic Patients Labs)  Once,   STAT    Question:  Rule Out  Answer:  Yes   04/29/19 1158   Signed and Held  HIV antibody (Routine Testing)  Once,   R     Signed and Held    Signed and Held  CBC  (enoxaparin (LOVENOX)    CrCl >/= 30 ml/min)  Once,   R    Comments: Baseline for enoxaparin therapy IF NOT ALREADY DRAWN.  Notify MD if PLT < 100 K.    Signed and Held   Signed and Held  Creatinine, serum  (enoxaparin (LOVENOX)    CrCl >/= 30 ml/min)  Weekly,   R    Comments: while on enoxaparin therapy    Signed and Held   Signed and Held  Basic metabolic panel  Tomorrow morning,   R     Signed and Held   Signed and Held  Magnesium  Tomorrow morning,   R     Signed and Held   Signed and Held  Phosphorus  Tomorrow morning,   R     Signed and Held   Signed and Held  CBC  Tomorrow morning,   R     Signed and Held          Vitals/Pain Today's Vitals   04/29/19 0824 04/29/19 0825 04/29/19 0935 04/29/19 1203  BP: (!) 167/83     Pulse: 74     Resp: 20     Temp: 98.5 F (36.9 C)     TempSrc: Oral     SpO2: 96%     Weight:  62.1 kg    PainSc:  10-Worst pain ever 7  4     Isolation Precautions No active isolations  Medications Medications  potassium chloride 10 mEq in 100 mL IVPB (10 mEq Intravenous New Bag/Given 04/29/19 1223)  morphine 4 MG/ML injection 4 mg (4 mg Intravenous Given 04/29/19 0832)  ondansetron (ZOFRAN) injection 4 mg (4 mg Intravenous Given 04/29/19 0832)  sodium chloride 0.9 % bolus 1,000 mL (0 mLs  Intravenous Stopped 04/29/19 1050)  alum & mag hydroxide-simeth (MAALOX/MYLANTA) 200-200-20 MG/5ML suspension 30 mL (30 mLs Oral Given 04/29/19 0937)    And  lidocaine (XYLOCAINE) 2 % viscous mouth solution 15 mL (15 mLs Oral Given 04/29/19 0937)  pantoprazole (PROTONIX) injection 40 mg (40 mg Intravenous Given 04/29/19 0937)  iohexol (OMNIPAQUE) 300 MG/ML solution 100 mL (100 mLs Intravenous Contrast Given 04/29/19 0956)  morphine 4 MG/ML injection 4 mg (4 mg Intravenous Given 04/29/19 1159)    Mobility walks     Focused Assessments see previous assessment   R Recommendations: See Admitting Provider Note  Report given to:    Additional Notes:

## 2019-04-29 NOTE — Consult Note (Signed)
PHARMACY CONSULT NOTE - FOLLOW UP  Pharmacy Consult for Electrolyte Monitoring and Replacement   Recent Labs: Potassium (mmol/L)  Date Value  04/29/2019 2.8 (L)  03/16/2013 3.2 (L)   Magnesium (mg/dL)  Date Value  04/29/2019 2.2   Calcium (mg/dL)  Date Value  04/29/2019 9.7   Calcium, Total (mg/dL)  Date Value  03/16/2013 9.3   Albumin (g/dL)  Date Value  04/29/2019 4.5  03/16/2013 3.9   Sodium (mmol/L)  Date Value  04/29/2019 143  03/16/2013 139     Assessment: Pharmacy consulted for electrolyte replacement.    Goal of Therapy:  WNL   Plan:  Patient is receiving KCl 60mEq in NS/D5 continuous that was started at 1430. Will hold off on further supplementation at this time.  Will recheck levels with AM labs tomorrow.  Pearla Dubonnet ,PharmD Clinical Pharmacist 04/29/2019 6:13 PM

## 2019-04-29 NOTE — ED Notes (Signed)
Attempted to call report - nurse states she will return call

## 2019-04-29 NOTE — ED Notes (Signed)
Pt given ice chips, ok per Dr. Dahlia Byes

## 2019-04-29 NOTE — H&P (Signed)
Patient ID: Brittany Stephenson, female   DOB: [birth year], 58 y.o.   MRN: [id number]  HPI Brittany Stephenson is a 58 y.o. female seen in consultation at the request of Dr. Azucena KubaSacks from the ER.  She came in with nausea vomiting abdominal pain.  She states that this happened earlier this morning.  Pain is intermittent colicky in nature and moderate in severity.  No specific alleviating or aggravating factors.  She did have multiple episodes of emesis.  No fevers no chills.  She did have a bowel movement this morning.  Is passing flatus.  CT scan personally reviewed there is evidence of mild dilation of the small bowel without pneumatosis without free air.  There is decompressed small bowel distally. CBC and CMP were normal except potassium of 2.8.  He is able to perform more than 4 METS of activity without any shortness of breath or chest pain.  She smokes daily. SHe did have a history of open cholecystectomy in the 1980s. Currently she did have episode of similar to this several years ago but required no hospital admission.  HPI  History reviewed. No pertinent past medical history.  Open Cholecystectomy 1980s  No pertinent fam hx  Social History Social History   Tobacco Use  . Smoking status: Current Every Day Smoker    Packs/day: 0.50    Types: Cigarettes  Substance Use Topics  . Alcohol use: Not on file  . Drug use: Not on file    Allergies  Allergen Reactions  . Penicillins Hives    Current Facility-Administered Medications  Medication Dose Route Frequency Provider Last Rate Last Dose  . potassium chloride 10 mEq in 100 mL IVPB  10 mEq Intravenous Q1 Hr x 3 Shaune PollackIsaacs, Cameron, MD 100 mL/hr at 04/29/19 1223 10 mEq at 04/29/19 1223   Current Outpatient Medications  Medication Sig Dispense Refill  . docusate sodium (COLACE) 100 MG capsule Take 1 tablet once or twice daily as needed for constipation while taking narcotic pain medicine 30 capsule 0  . HYDROcodone-acetaminophen (NORCO/VICODIN) 5-325 MG  tablet Take 1-2 tablets by mouth every 4 (four) hours as needed for moderate pain. 15 tablet 0  . potassium chloride SA (KLOR-CON M20) 20 MEQ tablet Take 1 tablet (20 mEq total) by mouth daily. 14 tablet 0  . sucralfate (CARAFATE) 1 g tablet Take 1 tablet (1 g total) by mouth 4 (four) times daily as needed (for abdominal discomfort, nausea, and/or vomiting). 40 tablet 1     Review of Systems Full ROS  was asked and was negative except for the information on the HPI  Physical Exam Blood pressure (!) 167/83, pulse 74, temperature 98.5 F (36.9 C), temperature source Oral, resp. rate 20, weight 62.1 kg, SpO2 96 %. CONSTITUTIONAL:NAd EYES: Pupils are equal, round, and reactive to light, Sclera are non-icteric. EARS, NOSE, MOUTH AND THROAT: The oropharynx is clear. The oral mucosa is pink and moist. Hearing is intact to voice. LYMPH NODES:  Lymph nodes in the neck are normal. RESPIRATORY:  Lungs are clear. There is normal respiratory effort, with equal breath sounds bilaterally, and without pathologic use of accessory muscles. CARDIOVASCULAR: Heart is regular without murmurs, gallops, or rubs. GI: The abdomen is  soft, mildly distended , non tender.. There are no palpable masses. There is no hepatosplenomegaly. There are normal bowel sounds in all quadrants. GU: Rectal deferred.   MUSCULOSKELETAL: Normal muscle strength and tone. No cyanosis or edema.   SKIN: Turgor is good and there are no pathologic skin  lesions or ulcers. NEUROLOGIC: Motor and sensation is grossly normal. Cranial nerves are grossly intact. PSYCH:  Oriented to person, place and time. Affect is normal.  Data Reviewed  I have personally reviewed the patient's imaging, laboratory findings and medical records.    Assessment/Plan Ileus versus small bowel obstruction likely related to hypokalemia.  Currently this time we will hold off on NG tube since the patient has refused.  She is in no acute distress.  No need for emergent  surgical intervention at this time.  We will do follow abdominal exam and x-rays.  Discussed with the patient in detail about the recommendation for admission and replacement of electrolytes as well as parenteral hydration.  She understands and agrees.  Caroleen Hamman, MD FACS General Surgeon 04/29/2019, 12:48 PM

## 2019-04-29 NOTE — ED Provider Notes (Signed)
[facility] Emergency Department Provider Note  ____________________________________________   First MD Initiated Contact with Patient 04/29/19 [id number]     (approximate)  I have reviewed the triage vital signs and the nursing notes.   HISTORY  Chief Complaint Abdominal Pain    HPI Brittany Stephenson is a 58 y.o. female  Here with abdominal pain, nausea, vomiting. Pt states her pain began gradually yesterday as initially mild then gradually worsening epigastric/RUQ pain. Pain is aching, gnawing, worse w/ eating and palpation. She then began NBNB emesis and has had emesis since onset. She reports one or two loose stools as well. No hematemesis or coffee-ground emesis. No blood in stools. Pt reports she's had similar sx "all the time" years ago, has had EGD but denies any known ulcers. No other complaints. No recent sick contacts. No fever or cough. No urinary sx. No vaginal bleeding or discharge.        History reviewed. No pertinent past medical history.  Patient Active Problem List   Diagnosis Date Noted   SBO (small bowel obstruction) (HCC) 04/29/2019    No past surgical history on file.  Prior to Admission medications   Medication Sig Start Date End Date Taking? Authorizing Provider  omeprazole (PRILOSEC OTC) 20 MG tablet Take 20 mg by mouth daily.   Yes [provider]  docusate sodium (COLACE) 100 MG capsule Take 1 tablet once or twice daily as needed for constipation while taking narcotic pain medicine Patient not taking: Reported on 04/29/2019 01/06/16   Loleta RoseForbach, Cory, MD  HYDROcodone-acetaminophen (NORCO/VICODIN) 5-325 MG tablet Take 1-2 tablets by mouth every 4 (four) hours as needed for moderate pain. Patient not taking: Reported on 04/29/2019 01/06/16   Loleta RoseForbach, Cory, MD  potassium chloride SA (KLOR-CON M20) 20 MEQ tablet Take 1 tablet (20 mEq total) by mouth daily. Patient not taking: Reported on 04/29/2019 01/06/16   Loleta RoseForbach, Cory, MD    sucralfate (CARAFATE) 1 g tablet Take 1 tablet (1 g total) by mouth 4 (four) times daily as needed (for abdominal discomfort, nausea, and/or vomiting). Patient not taking: Reported on 04/29/2019 01/06/16   Loleta RoseForbach, Cory, MD    Allergies Penicillins  No family history on file.  Social History Social History   Tobacco Use   Smoking status: Current Every Day Smoker    Packs/day: 0.50    Types: Cigarettes  Substance Use Topics   Alcohol use: Not on file   Drug use: Not on file    Review of Systems  Review of Systems  Constitutional: Positive for fatigue. Negative for fever.  HENT: Negative for congestion and sore throat.   Eyes: Negative for visual disturbance.  Respiratory: Negative for cough and shortness of breath.   Cardiovascular: Negative for chest pain.  Gastrointestinal: Positive for abdominal pain, nausea and vomiting. Negative for diarrhea.  Genitourinary: Negative for flank pain.  Musculoskeletal: Negative for back pain and neck pain.  Skin: Negative for rash and wound.  Neurological: Negative for weakness.  All other systems reviewed and are negative.    ____________________________________________  PHYSICAL EXAM:      VITAL SIGNS: ED Triage Vitals  Enc Vitals Group     BP 04/29/19 0824 (!) 167/83     Pulse Rate 04/29/19 0824 74     Resp 04/29/19 0824 20     Temp 04/29/19 0824 98.5 F (36.9 C)     Temp Source 04/29/19 0824 Oral     SpO2 04/29/19 0824 96 %  Weight 04/29/19 0825 137 lb (62.1 kg)     Height --      Head Circumference --      Peak Flow --      Pain Score 04/29/19 0825 10     Pain Loc --      Pain Edu? --      Excl. in GC? --      Physical Exam Vitals signs and nursing note reviewed.  Constitutional:      General: She is not in acute distress.    Appearance: She is well-developed.  HENT:     Head: Normocephalic and atraumatic.  Eyes:     Conjunctiva/sclera: Conjunctivae normal.  Neck:     Musculoskeletal: Neck supple.   Cardiovascular:     Rate and Rhythm: Normal rate and regular rhythm.     Heart sounds: Normal heart sounds. No murmur. No friction rub.  Pulmonary:     Effort: Pulmonary effort is normal. No respiratory distress.     Breath sounds: Normal breath sounds. No wheezing or rales.  Abdominal:     General: Bowel sounds are normal. There is no distension.     Palpations: Abdomen is soft.     Tenderness: There is abdominal tenderness in the right upper quadrant and epigastric area. There is no guarding or rebound.  Skin:    General: Skin is warm.     Capillary Refill: Capillary refill takes less than 2 seconds.  Neurological:     Mental Status: She is alert and oriented to person, place, and time.     Motor: No abnormal muscle tone.       ____________________________________________   LABS (all labs ordered are listed, but only abnormal results are displayed)  Labs Reviewed  CBC WITH DIFFERENTIAL/PLATELET - Abnormal; Notable for the following components:      Result Value   MCV 74.3 (*)    MCH 24.3 (*)    RDW 17.0 (*)    All other components within normal limits  COMPREHENSIVE METABOLIC PANEL - Abnormal; Notable for the following components:   Potassium 2.8 (*)    Glucose, Bld 132 (*)    Total Protein 8.8 (*)    All other components within normal limits  URINALYSIS, COMPLETE (UACMP) WITH MICROSCOPIC - Abnormal; Notable for the following components:   Color, Urine YELLOW (*)    APPearance CLEAR (*)    Specific Gravity, Urine >1.046 (*)    Hgb urine dipstick SMALL (*)    Protein, ur 30 (*)    All other components within normal limits  SARS CORONAVIRUS 2 (HOSPITAL ORDER, PERFORMED IN [facility])  LIPASE, BLOOD  MAGNESIUM  POTASSIUM  HIV ANTIBODY (ROUTINE TESTING W REFLEX)  CBC    ____________________________________________  EKG: Normal sinus rhythm, ventricular rate 66.  PR 168, QRS 125, QTc 558.  No acute ischemic changes.  QT is  prolonged. ________________________________________  RADIOLOGY All imaging, including plain films, CT scans, and ultrasounds, independently reviewed by me, and interpretations confirmed via formal radiology reads.  ED MD interpretation:   CT A/P: Proximal SBO, ? Cystitis, small hiatal hernia  Official radiology report(s): Ct Abdomen Pelvis W Contrast  Result Date: 04/29/2019 CLINICAL DATA:  Abdominal pain with nausea and vomiting EXAM: CT ABDOMEN AND PELVIS WITH CONTRAST TECHNIQUE: Multidetector CT imaging of the abdomen and pelvis was performed using the standard protocol following bolus administration of intravenous contrast. CONTRAST:  100mL OMNIPAQUE IOHEXOL 300 MG/ML  SOLN COMPARISON:  January 05, 2013 FINDINGS:  Lower chest: There is no appreciable lung base edema or consolidation. There is a small hiatal hernia. Hepatobiliary: There is hepatic steatosis with hepatic steatosis somewhat more severe near the fissure for the ligamentum teres. No focal liver lesions are appreciable. Gallbladder is absent. There is no appreciable biliary duct dilatation. Pancreas: There is no pancreatic mass or inflammatory focus. Spleen: The spleen is rather small without focal splenic lesion evident. Adrenals/Urinary Tract: Adrenals bilaterally appear normal. There is a 7 mm cyst in the posterior upper left kidney. There is no evident renal hydronephrosis on either side. There is no renal or ureteral calculus on either side. Urinary bladder is midline. Urinary bladder wall is mildly thickened. Stomach/Bowel: Stomach is normal in size. There is dilatation loops of proximal jejunum with an apparent transition zone near the junction of the proximal and mid jejunum in the left upper quadrant consistent with a degree of proximal small-bowel obstruction. Bowel loops more distally appear normal. Terminal ileum appears normal. No free air or portal venous air is evident. Vascular/Lymphatic: There is no abdominal aortic aneurysm.  There is aortic and iliac artery atherosclerotic calcification. No adenopathy is evident in the abdomen or pelvis. Reproductive: Uterus is retroverted.  No pelvic mass evident. Other: The appendix appears normal. There is no abscess or ascites in the abdomen or pelvis. Musculoskeletal: There are foci of degenerative change in the lumbar spine. There are no blastic or lytic bone lesions. IMPRESSION: 1. There is a degree of proximal small bowel obstruction with transition zone in the left upper abdomen near the junction of the proximal and mid thirds of the jejunum. Bowel elsewhere appears unremarkable. 2. Urinary bladder wall thickening consistent with a degree of cystitis. No renal or ureteral calculus. No hydronephrosis on either side. 3.  Small hiatal hernia. 4. Hepatic steatosis. Gallbladder absent. No biliary duct dilatation evident. 5.  Aortic and iliac artery atherosclerosis. Electronically Signed   By: Bretta BangWilliam  Woodruff III M.D.   On: 04/29/2019 10:15    ____________________________________________  PROCEDURES   Procedure(s) performed (including Critical Care):  Procedures  ____________________________________________  INITIAL IMPRESSION / MDM / ASSESSMENT AND PLAN / ED COURSE  As part of my medical decision making, I reviewed the following data within the electronic MEDICAL RECORD NUMBER Notes from prior ED visits and [place] Controlled Substance Database      *Brittany FasterJoyce Stephenson was evaluated in Emergency Department on 04/29/2019 for the symptoms described in the history of present illness. She was evaluated in the context of the global COVID-19 pandemic, which necessitated consideration that the patient might be at risk for infection with the SARS-CoV-2 virus that causes COVID-19. Institutional protocols and algorithms that pertain to the evaluation of patients at risk for COVID-19 are in a state of rapid change based on information released by regulatory bodies including the CDC and federal and state  organizations. These policies and algorithms were followed during the patient's care in the ED.  Some ED evaluations and interventions may be delayed as a result of limited staffing during the pandemic.*   Clinical Course as of Apr 28 1430  Sun Apr 29, 2019  0850 58 yo F here with RUQ abd pain, nausea, vomiting. Sp cholecystectomy. Pt is afebrile, non-toxic but w/ significant TTP on exam. DDx includes gastritis, PUD, choledocholithiasis, pancreatitis, hepatitis, transverse colitis. EKG non-ischemic, doubt referred cardiac etiology. Afebrile w/o signs of sepsis.   [CI]  1038 Labs reviewed.  White count normal which is reassuring.  CMP consistent dehydration with significant hypokalemia.  Replacement has been ordered.  Lipase normal.  Urinalysis pending.  CT scan concerning for proximal partial small bowel obstruction.  This would explain her vomiting and sense of distention, though she has had some loose bowel movements.  Will start on IV fluids and plan for admission.  General surgery paged.   [CI]    Clinical Course User Index [CI] Duffy Bruce, MD    Medical Decision Making: Dr. Dahlia Byes to admit.  ____________________________________________  FINAL CLINICAL IMPRESSION(S) / ED DIAGNOSES  Final diagnoses:  SBO (small bowel obstruction) ([place])  Hypokalemia     MEDICATIONS GIVEN DURING THIS VISIT:  Medications  potassium chloride 10 mEq in 100 mL IVPB (10 mEq Intravenous New Bag/Given 04/29/19 1333)  enoxaparin (LOVENOX) injection 40 mg (has no administration in time range)  dextrose 5 % and 0.9 % NaCl with KCl 40 mEq/L infusion (has no administration in time range)  ketorolac (TORADOL) 30 MG/ML injection 30 mg (has no administration in time range)  morphine 2 MG/ML injection 2 mg (has no administration in time range)  ondansetron (ZOFRAN-ODT) disintegrating tablet 4 mg (has no administration in time range)    Or  ondansetron (ZOFRAN) injection 4 mg (has no administration in time  range)  prochlorperazine (COMPAZINE) tablet 10 mg (has no administration in time range)    Or  prochlorperazine (COMPAZINE) injection 5-10 mg (has no administration in time range)  pantoprazole (PROTONIX) injection 40 mg (has no administration in time range)  hydrALAZINE (APRESOLINE) injection 10 mg (has no administration in time range)  morphine 4 MG/ML injection 4 mg (4 mg Intravenous Given 04/29/19 0832)  ondansetron (ZOFRAN) injection 4 mg (4 mg Intravenous Given 04/29/19 0832)  sodium chloride 0.9 % bolus 1,000 mL (0 mLs Intravenous Stopped 04/29/19 1050)  alum & mag hydroxide-simeth (MAALOX/MYLANTA) 200-200-20 MG/5ML suspension 30 mL (30 mLs Oral Given 04/29/19 0937)    And  lidocaine (XYLOCAINE) 2 % viscous mouth solution 15 mL (15 mLs Oral Given 04/29/19 0937)  pantoprazole (PROTONIX) injection 40 mg (40 mg Intravenous Given 04/29/19 0937)  iohexol (OMNIPAQUE) 300 MG/ML solution 100 mL (100 mLs Intravenous Contrast Given 04/29/19 0956)  morphine 4 MG/ML injection 4 mg (4 mg Intravenous Given 04/29/19 1159)     ED Discharge Orders    None       Note:  This document was prepared using Dragon voice recognition software and may include unintentional dictation errors.   Duffy Bruce, MD 04/29/19 1431

## 2019-04-29 NOTE — Consult Note (Addendum)
PHARMACY CONSULT NOTE - FOLLOW UP  Pharmacy Consult for Electrolyte Monitoring and Replacement   Recent Labs: Potassium (mmol/L)  Date Value  04/29/2019 2.8 (L)  03/16/2013 3.2 (L)   Magnesium (mg/dL)  Date Value  04/29/2019 2.2   Calcium (mg/dL)  Date Value  04/29/2019 9.7   Calcium, Total (mg/dL)  Date Value  03/16/2013 9.3   Albumin (g/dL)  Date Value  04/29/2019 4.5  03/16/2013 3.9   Sodium (mmol/L)  Date Value  04/29/2019 143  03/16/2013 139     Assessment: Pharmacy consulted for electrolyte replacement.    Goal of Therapy:  WNL   Plan:  ED MD ordered KCl 10 mEq x 3 IV and follow up with potassium level @1800 .   Oswald Hillock ,PharmD Clinical Pharmacist 04/29/2019 12:14 PM

## 2019-04-30 ENCOUNTER — Inpatient Hospital Stay: Payer: Self-pay

## 2019-04-30 DIAGNOSIS — E876 Hypokalemia: Secondary | ICD-10-CM

## 2019-04-30 DIAGNOSIS — K567 Ileus, unspecified: Secondary | ICD-10-CM

## 2019-04-30 LAB — BASIC METABOLIC PANEL
Anion gap: 5 (ref 5–15)
BUN: 13 mg/dL (ref 6–20)
CO2: 25 mmol/L (ref 22–32)
Calcium: 8.5 mg/dL — ABNORMAL LOW (ref 8.9–10.3)
Chloride: 113 mmol/L — ABNORMAL HIGH (ref 98–111)
Creatinine, Ser: 0.93 mg/dL (ref 0.44–1.00)
GFR calc Af Amer: >60 mL/min (ref >60)
GFR calc non Af Amer: >60 mL/min (ref >60)
Glucose, Bld: 99 mg/dL (ref 70–99)
Potassium: 3.2 mmol/L — ABNORMAL LOW (ref 3.5–5.1)
Sodium: 143 mmol/L (ref 135–145)

## 2019-04-30 LAB — CBC
HCT: 33.3 % — ABNORMAL LOW (ref 36.0–46.0)
Hemoglobin: 10.4 g/dL — ABNORMAL LOW (ref 12.0–15.0)
MCH: 24.1 pg — ABNORMAL LOW (ref 26.0–34.0)
MCHC: 31.2 g/dL (ref 30.0–36.0)
MCV: 77.1 fL — ABNORMAL LOW (ref 80.0–100.0)
Platelets: 294 10*3/uL (ref 150–400)
RBC: 4.32 MIL/uL (ref 3.87–5.11)
RDW: 17.1 % — ABNORMAL HIGH (ref 11.5–15.5)
WBC: 11 10*3/uL — ABNORMAL HIGH (ref 4.0–10.5)
nRBC: 0 % (ref 0.0–0.2)

## 2019-04-30 LAB — MAGNESIUM: Magnesium: 2.2 mg/dL (ref 1.7–2.4)

## 2019-04-30 LAB — PHOSPHORUS: Phosphorus: 2 mg/dL — ABNORMAL LOW (ref 2.5–4.6)

## 2019-04-30 MED ORDER — POTASSIUM CHLORIDE CRYS ER 20 MEQ PO TBCR
20.0000 meq | EXTENDED_RELEASE_TABLET | Freq: Once | ORAL | Status: AC
  Administered 2019-04-30: 17:00:00 20 meq via ORAL
  Filled 2019-04-30: qty 1

## 2019-04-30 MED ORDER — POTASSIUM PHOSPHATES 15 MMOLE/5ML IV SOLN
20.0000 mmol | Freq: Once | INTRAVENOUS | Status: AC
  Administered 2019-04-30: 20 mmol via INTRAVENOUS
  Filled 2019-04-30: qty 6.67

## 2019-04-30 NOTE — Discharge Summary (Addendum)
[facility] SURGICAL DISCHARGE SUMMARY (cpt: [phone] [id number])  Patient ID: Brittany FasterJoyce Hargreaves MRN: [id number] DOB/AGE: 05-01-1961 58 y.o.  Admit date: 04/29/2019 Discharge date: 04/30/2019  Discharge Diagnoses SBO vs Ileus   Consultants None  Procedures None  HPI: Brittany Stephenson is a 58 y.o. female seen in consultation at the request of Dr. Azucena KubaSacks from the ER.  She came in with nausea vomiting abdominal pain.  She states that this happened earlier this morning.  Pain is intermittent colicky in nature and moderate in severity.  No specific alleviating or aggravating factors.  She did have multiple episodes of emesis.  No fevers no chills.  She did have a bowel movement this morning.  Is passing flatus.  CT scan personally reviewed there is evidence of mild dilation of the small bowel without pneumatosis without free air.  There is decompressed small bowel distally. CBC and CMP were normal except potassium of 2.8.  He is able to perform more than 4 METS of activity without any shortness of breath or chest pain.  She smokes daily. SHe did have a history of open cholecystectomy in the 1980s. Currently she did have episode of similar to this several years ago but required no hospital admission.  Hospital Course: She was admitted to general surgery. Due to resolution of symptoms and evidence of bowel function returning, NGT was deferred. On HD1 (07/13), she reported resolution in her abdominal pain, nausea, and emesis. She continued to pass flatus throughout the course of her hospital stay. Her diet was advanced throughout the day and she tolerated this well. Her K+ was repleted and trending towards normal. On the day of discharge, she was pain free without N/V, continue to have evidence of bowel function return, and tolerated advancement of a diet. The remainder of patient's hospital course was essentially unremarkable, and discharge planning was initiated accordingly with patient safely able to be  discharged home with appropriate discharge instructions and outpatient follow-up after all of her questions were answered to her expressed satisfaction.  Discharge Condition: Good   Physical Examination:  Constitutional: Well appearing female, NAD Pulmonary: Normal effort, no respiratory distress Gastrointestinal: Soft, non-tender, non-distended, no rebound Skin: warm, dry   Allergies as of 04/30/2019      Reactions   Penicillins Hives      Medication List    TAKE these medications   docusate sodium 100 MG capsule Commonly known as: Colace Take 1 tablet once or twice daily as needed for constipation while taking narcotic pain medicine   HYDROcodone-acetaminophen 5-325 MG tablet Commonly known as: NORCO/VICODIN Take 1-2 tablets by mouth every 4 (four) hours as needed for moderate pain.   omeprazole 20 MG tablet Commonly known as: PRILOSEC OTC Take 20 mg by mouth daily.   potassium chloride SA 20 MEQ tablet Commonly known as: Klor-Con M20 Take 1 tablet (20 mEq total) by mouth daily.   sucralfate 1 g tablet Commonly known as: Carafate Take 1 tablet (1 g total) by mouth 4 (four) times daily as needed (for abdominal discomfort, nausea, and/or vomiting).        Follow-up Information    PCP Follow up.   Why: Please follow up with primary care physician for recent hospitalization for bowel obstruction and low potassium           Time spent on discharge management including discussion of hospital course, clinical condition, outpatient instructions, prescriptions, and follow up with the patient and members of the medical team: >30 minutes  -- Lynden OxfordZachary Piper Albro ,  PA-C [facility]  04/30/2019, 2:27 PM [phone] M-F: 7am - 4pm

## 2019-04-30 NOTE — Consult Note (Signed)
PHARMACY CONSULT NOTE - FOLLOW UP  Pharmacy Consult for Electrolyte Monitoring and Replacement   Recent Labs: Potassium (mmol/L)  Date Value  04/30/2019 3.2 (L)  03/16/2013 3.2 (L)   Magnesium (mg/dL)  Date Value  04/30/2019 2.2   Calcium (mg/dL)  Date Value  04/30/2019 8.5 (L)   Calcium, Total (mg/dL)  Date Value  03/16/2013 9.3   Albumin (g/dL)  Date Value  04/29/2019 4.5  03/16/2013 3.9   Phosphorus (mg/dL)  Date Value  04/30/2019 2.0 (L)   Sodium (mmol/L)  Date Value  04/30/2019 143  03/16/2013 139   Corrected Calcium:  8.9  Assessment: Pharmacy consulted for electrolyte replacement. Presently, potassium and phosphorus levels are low and not replacement for today. Patient is NPO.  Current Medications: - KCl 86mEq in NS/D5 continuous   Goal of Therapy:  WNL   Plan:  Will order Potassium Phosphate IV 20 mmol x 1 dose.  Will recheck levels with AM labs tomorrow.  Rowland Lathe ,PharmD Clinical Pharmacist 04/30/2019 7:16 AM

## 2019-05-01 LAB — HIV ANTIBODY (ROUTINE TESTING W REFLEX): HIV Screen 4th Generation wRfx: NONREACTIVE

## 2019-07-30 ENCOUNTER — Other Ambulatory Visit: Payer: Self-pay

## 2019-07-31 ENCOUNTER — Encounter: Payer: Self-pay | Admitting: *Deleted

## 2019-07-31 ENCOUNTER — Ambulatory Visit: Payer: Self-pay | Attending: Oncology | Admitting: *Deleted

## 2019-07-31 ENCOUNTER — Other Ambulatory Visit: Payer: Self-pay

## 2019-07-31 ENCOUNTER — Ambulatory Visit
Admission: RE | Admit: 2019-07-31 | Discharge: 2019-07-31 | Disposition: A | Discharge status: Home / Self Care | Payer: Self-pay | Source: Ambulatory Visit | Attending: Oncology | Admitting: Oncology

## 2019-07-31 VITALS — BP 144/88 | Temp 96.2°F | Ht 61.0 in | Wt 147.0 lb

## 2019-07-31 DIAGNOSIS — Z0000 Encounter for general adult medical examination without abnormal findings: Secondary | ICD-10-CM

## 2019-07-31 NOTE — Progress Notes (Signed)
  Subjective:     Patient ID: Brittany Stephenson, female   DOB: 1961-06-21, 58 y.o.   MRN: [id number]  HPI   Review of Systems     Objective:   Physical Exam Chest:     Breasts:        Right: No swelling, bleeding, inverted nipple, mass, nipple discharge, skin change or tenderness.        Left: No swelling, bleeding, inverted nipple, mass, nipple discharge, skin change or tenderness.  Abdominal:     Palpations: There is no hepatomegaly or splenomegaly.    Genitourinary:    Exam position: Lithotomy position.     Labia:        Right: No rash, tenderness, lesion or injury.        Left: No rash, tenderness, lesion or injury.      Urethra: No prolapse, urethral pain, urethral swelling or urethral lesion.     Vagina: No signs of injury and foreign body. No vaginal discharge, erythema, tenderness, bleeding, lesions or prolapsed vaginal walls.     Cervix: No cervical motion tenderness, discharge, friability, lesion, erythema or cervical bleeding.     Uterus: Not deviated and not enlarged.      Adnexa:        Right: No mass.         Left: No mass.    Lymphadenopathy:     Upper Body:     Right upper body: No supraclavicular or axillary adenopathy.     Left upper body: No supraclavicular or axillary adenopathy.        Assessment:     58 year old Black female presents to [facility] for clinical breast exam, pap and mammogram.  Clinical breast exam unremarkable.  Taught self breast awareness.  Specimen collected for pap smear without difficulty.  Patient has been screened for eligibility.  She does not have any insurance, Medicare or Medicaid.  She also meets financial eligibility.  Hand-out given on the Affordable Care Act.  Risk Assessment    No risk assessment data for the current encounter   Risk Scores      07/26/2019   Last edited by: Orson Slick, CMA   5-year risk: 1.1 %   Lifetime risk: 5.8 %            Plan:     Screening mammogram ordered.  Specimen for pap smear sent to  the lab.  Will follow up per [facility] protocol.

## 2019-07-31 NOTE — Patient Instructions (Signed)
Gave patient hand-out, Women Staying Healthy, Active and Well from [facility], with education on breast health, pap smears, heart and colon health. 

## 2019-08-08 LAB — PAP LB AND HPV HIGH-RISK: HPV, high-risk: POSITIVE — AB

## 2019-08-17 ENCOUNTER — Encounter: Payer: Self-pay | Admitting: *Deleted

## 2019-08-17 NOTE — Progress Notes (Signed)
Letter mailed to inform patient of her normal mammogram and pap smear with HPV positive results.  Scheduled to return in 1 year on 08/06/20 @ 9:30 for next pap and mammo.  HSIS to [place].

## 2019-11-09 ENCOUNTER — Ambulatory Visit: Payer: Self-pay | Attending: Internal Medicine

## 2019-11-09 DIAGNOSIS — Z20822 Contact with and (suspected) exposure to covid-19: Secondary | ICD-10-CM | POA: Insufficient documentation

## 2019-11-10 LAB — NOVEL CORONAVIRUS, NAA: SARS-CoV-2, NAA: NOT DETECTED

## 2020-07-29 ENCOUNTER — Other Ambulatory Visit: Payer: Self-pay

## 2020-07-29 ENCOUNTER — Ambulatory Visit
Admission: RE | Admit: 2020-07-29 | Discharge: 2020-07-29 | Disposition: A | Discharge status: Home / Self Care | Payer: Self-pay | Source: Ambulatory Visit | Attending: Oncology | Admitting: Oncology

## 2020-07-29 ENCOUNTER — Ambulatory Visit: Payer: Self-pay | Attending: Oncology | Admitting: *Deleted

## 2020-07-29 ENCOUNTER — Encounter: Payer: Self-pay | Admitting: *Deleted

## 2020-07-29 VITALS — BP 141/81 | HR 66 | Temp 97.3°F | Ht 61.16 in | Wt 150.3 lb

## 2020-07-29 DIAGNOSIS — Z0000 Encounter for general adult medical examination without abnormal findings: Secondary | ICD-10-CM | POA: Insufficient documentation

## 2020-07-29 NOTE — Progress Notes (Signed)
Subjective:     Patient ID: Brittany Stephenson, female   DOB: 04-11-1961, 59 y.o.   MRN: [id number]  HPI   [facility] Medical History Record - 07/29/20 [id number]      Breast History   Screening cycle New    Provider (C[facility]) [facility]    Initial Mammogram 07/29/20    Last Mammogram Annual    Last Mammogram Date 07/31/19    Provider (Mammogram)  [facility]    Recent Breast Symptoms None      Breast Cancer History   Comments/Details paternal first cousin in her 58's      Previous History of Breast Problems   Breast Surgery or Biopsy None    Breast Implants N/A    BSE Done Monthly      Gynecological/Obstetrical History   LMP --   1995   Is there any chance that the client could be pregnant?  No    Age at menarche 59    Age at menopause 59    PAP smear history Annually    Date of last PAP  07/31/19    Provider (PAP) [facility]   neg/HPV pos.    Age at first live birth 4    Breast fed children No    DES Exposure Unkown    Cervical, Uterine or Ovarian cancer No    Family history of Cervial, Uterine or Ovarian cancer No    Hysterectomy No    Cervix removed No    Ovaries removed No    Laser/Cryosurgery No    Current method of birth control Other (see comments)   tubal ligation   Current method of Estrogen/Hormone replacement None    Smoking history Yes   < 1 ppd           Review of Systems     Objective:   Physical Exam Chest:     Breasts:        Right: No swelling, bleeding, inverted nipple, mass, nipple discharge, skin change or tenderness.        Left: No swelling, bleeding, inverted nipple, mass, nipple discharge, skin change or tenderness.  Abdominal:     Palpations: There is no hepatomegaly or splenomegaly.    Genitourinary:    Exam position: Lithotomy position.     Labia:        Right: No rash, tenderness, lesion or injury.        Left: No rash, tenderness, lesion or injury.      Urethra: No prolapse, urethral pain, urethral swelling or urethral lesion.     Vagina: No  signs of injury and foreign body. No vaginal discharge, erythema, tenderness, bleeding, lesions or prolapsed vaginal walls.     Cervix: No cervical motion tenderness, discharge, friability, lesion, erythema, cervical bleeding or eversion.     Uterus: Normal.      Adnexa:        Right: No mass.         Left: No mass.       Lymphadenopathy:     Upper Body:     Right upper body: No supraclavicular or axillary adenopathy.     Left upper body: No supraclavicular or axillary adenopathy.        Assessment:     59 year old Black female returns to [facility] for annual screening.  Clinical breast exam unremarkable.  Taught self breast awareness.  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  Specimen collected for pap smear.  Reviewed possible need  for gyn consult if HPV positive again.  Patient has been screened for eligibility.  She does not have any insurance, Medicare or Medicaid.  She also meets financial eligibility.  Hand-out given on the Affordable Care Act.    Tyrer-Cuzick breast cancer risk assessment with a lifetime risk of 5.5%.  Per NCCN guidelines no modified imaging or genetic testing is recommended. Risk Assessment    Risk Scores      07/29/2020 07/26/2019   Last edited by: Scarlett Presto, RN [place], Freada Bergeron, CMA   5-year risk: 1.1 % 1.1 %   Lifetime risk: 5.6 % 5.8 %            Plan:     Screening mammogram ordered.  Will follow up per [facility] protocol.

## 2020-07-29 NOTE — Patient Instructions (Signed)
HPV Test Why am I having this test? HPV (human papillomavirus) refers to a group of about 100 viruses. Many of these viruses cause growths on, in, or around the genitals. Most HPV viruses cause infections that usually go away without treatment.  The HPV test checks for high-risk types (strains) of HPV. Strains 16 and 18 are considered the most high-risk for cancer. If you have strain 16 or 18 HPV and it is not treated, it can increase your risk for cancer of the cervix, vagina, vulva, or anus. HPV can be found in both males and females. However, the HPV test is used to screen for increased cancer risk in females:  Who are 30-65 years old.  Who have an abnormal Pap test.  Who have been treated for an abnormal Pap test in the past.  Who have been treated for a high-risk HPV infection in the past. If you are a woman older than 30, you may have the HPV test at the same time as a pelvic exam and Pap test. What is being tested? This test checks for the DNA (genetic) strands of the HPV infection. This test is also called the HPV DNA test. What kind of sample is taken?  This test requires a sample of cells from the cervix. This will be done using a small cotton swab, plastic spatula, or brush. This sample is often collected during a pelvic exam, when you are lying on your back on an exam table with feet in footrests (stirrups). How do I prepare for this test?  Starting 24-48 hours before your test, or as told by your health care provider, do not: ? Take a bath. ? Have sex. ? Douche.  Schedule the test for a day when you are not menstruating. If you are menstruating on the day of the test, you may need to reschedule.  You will be asked to urinate right before the test. How are the results reported? Your test results will be reported as either positive or negative for HPV. If you have a positive result, the results will indicate which HPV strain you are positive for. What do the results mean? A  negative HPV test result means that no HPV was found. This means it is very likely that you do not have HPV. A positive HPV test result means that you have HPV.  If your results show the presence of any high-risk strains, you may have a higher risk of developing anal or cervical cancer if your infection is not treated.  If any low-risk HPV strains are found, it is not likely that you have an increased risk for cancer. Talk with your health care provider about what your results mean. Questions to ask your health care provider Ask your health care provider, or the department that is doing the test:  When will my results be ready?  How will I get my results?  What are my treatment options?  What other tests do I need?  What are my next steps? Summary  The human papillomavirus (HPV) test is used to look for high-risk types of HPV infection. This test is done only for females.  HPV types 16 and 18 are considered high-risk types of HPV. If untreated, these types of infections increase your risk for cancer of the cervix or anus.  A negative HPV test result means that no HPV was found, and it is very likely that you do not have HPV.  A positive HPV test result means that you   have an HPV infection. This information is not intended to replace advice given to you by your health care provider. Make sure you discuss any questions you have with your health care provider. Document Revised: 09/16/2017 Document Reviewed: 08/16/2017 Elsevier Patient Education  2020 Elsevier Inc.  

## 2020-07-30 ENCOUNTER — Encounter: Payer: Self-pay | Admitting: *Deleted

## 2020-08-04 LAB — IGP, APTIMA HPV: HPV Aptima: POSITIVE — AB

## 2020-08-05 ENCOUNTER — Encounter: Payer: Self-pay | Admitting: *Deleted

## 2020-08-05 NOTE — Progress Notes (Signed)
Called patient to review her normal mammogram results and pap results of negative / HPV+.  Discussed since this was the second year in a row that she has had an HPV+ result, the guidelines recommend a colposcopy.  I have scheduled her to see Dr. Jerene Pitch at [facility] on 08/22/20 @ 8:30.  Will follow up per [facility] protocol.

## 2020-08-06 ENCOUNTER — Ambulatory Visit: Payer: Self-pay

## 2020-08-22 ENCOUNTER — Ambulatory Visit (INDEPENDENT_AMBULATORY_CARE_PROVIDER_SITE_OTHER): Payer: Self-pay | Admitting: Obstetrics and Gynecology

## 2020-08-22 ENCOUNTER — Encounter: Payer: Self-pay | Admitting: Obstetrics and Gynecology

## 2020-08-22 ENCOUNTER — Other Ambulatory Visit: Payer: Self-pay

## 2020-08-22 ENCOUNTER — Other Ambulatory Visit (HOSPITAL_COMMUNITY)
Admission: RE | Admit: 2020-08-22 | Discharge: 2020-08-22 | Disposition: A | Discharge status: Home / Self Care | Payer: Self-pay | Source: Ambulatory Visit | Attending: Obstetrics and Gynecology | Admitting: Obstetrics and Gynecology

## 2020-08-22 VITALS — BP 128/72 | Ht 61.0 in | Wt 150.2 lb

## 2020-08-22 DIAGNOSIS — R87618 Other abnormal cytological findings on specimens from cervix uteri: Secondary | ICD-10-CM | POA: Insufficient documentation

## 2020-08-22 DIAGNOSIS — B977 Papillomavirus as the cause of diseases classified elsewhere: Secondary | ICD-10-CM

## 2020-08-22 NOTE — Progress Notes (Signed)
   GYNECOLOGY CLINIC COLPOSCOPY PROCEDURE NOTE  59 y.o. No obstetric history on file. here for colposcopy for NIL and HR HPV+  pap smear on 07/29/2020. Discussed underlying role for HPV infection in the development of cervical dysplasia, its natural history and progression/regression, need for surveillance.  Is the patient  pregnant: No LMP: No LMP recorded. Patient is postmenopausal. Smoking status:  reports that she has been smoking cigarettes. She has been smoking about 0.50 packs per day. She has never used smokeless tobacco. Contraception: none Future fertility desired:  No  Patient given informed consent, signed copy in the chart, time out was performed.  The patient was position in dorsal lithotomy position. Speculum was placed the cervix was visualized.   After application of acetic acid colposcopic inspection of the cervix was undertaken.   Colposcopy adequate, full visualization of transformation zone: No acetowhite lesion(s) noted at 6 o'clock; corresponding biopsies obtained.   ECC specimen obtained:  Yes  All specimens were labeled and sent to pathology.   Patient was given post procedure instructions.  Will follow up pathology and manage accordingly.  Routine preventative health maintenance measures emphasized.  Physical Exam Genitourinary:        Adelene Idler MD [facility], [facility] 08/22/2020 8:49 AM

## 2020-08-22 NOTE — Patient Instructions (Signed)
Colposcopy, Care After This sheet gives you information about how to care for yourself after your procedure. Your doctor may also give you more specific instructions. If you have problems or questions, contact your doctor. What can I expect after the procedure? If you did not have a tissue sample removed (did not have a biopsy), you may only have some spotting for a few days. You can go back to your normal activities. If you had a tissue sample removed, it is common to have:  Soreness and pain. This may last for a few days.  Light-headedness.  Mild bleeding from your vagina or dark-colored, grainy discharge from your vagina. This may last for a few days. You may need to wear a sanitary pad.  Spotting for at least 48 hours after the procedure. Follow these instructions at home:   Take over-the-counter and prescription medicines only as told by your doctor. Ask your doctor what medicines you can start taking again. This is very important if you take blood-thinning medicine.  Do not drive or use heavy machinery while taking prescription pain medicine.  For 3 days, or as long as your doctor tells you, avoid: ? Douching. ? Using tampons. ? Having sex.  If you use birth control (contraception), keep using it.  Limit activity for the first day after the procedure. Ask your doctor what activities are safe for you.  It is up to you to get the results of your procedure. Ask your doctor when your results will be ready.  Keep all follow-up visits as told by your doctor. This is important. Contact a doctor if:  You get a skin rash. Get help right away if:  You are bleeding a lot from your vagina. It is a lot of bleeding if you are using more than one pad an hour for 2 hours in a row.  You have clumps of blood (blood clots) coming from your vagina.  You have a fever.  You have chills  You have pain in your lower belly (pelvic area).  You have signs of infection, such as vaginal  discharge that is: ? Different than usual. ? Yellow. ? Bad-smelling.  You have very pain or cramps in your lower belly that do not get better with medicine.  You feel light-headed.  You feel dizzy.  You pass out (faint). Summary  If you did not have a tissue sample removed (did not have a biopsy), you may only have some spotting for a few days. You can go back to your normal activities.  If you had a tissue sample removed, it is common to have mild pain and spotting for 48 hours.  For 3 days, or as long as your doctor tells you, avoid douching, using tampons and having sex.  Get help right away if you have bleeding, very bad pain, or signs of infection. This information is not intended to replace advice given to you by your health care provider. Make sure you discuss any questions you have with your health care provider. Document Revised: 09/16/2017 Document Reviewed: 06/23/2016 Elsevier Patient Education  2020 Elsevier Inc.  

## 2020-08-25 LAB — SURGICAL PATHOLOGY

## 2020-08-28 ENCOUNTER — Encounter: Payer: Self-pay | Admitting: *Deleted

## 2020-08-28 NOTE — Progress Notes (Signed)
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  Explained that per ASSCP guidelines, she would at least need another pap smear in 1 year, unless Dr. Jerene Pitch wanted another pap sooner.  I have scheduled her to return to [facility] on 08/19/21 @ 9:00.  Will cc Dr. Jerene Pitch my notes.

## 2021-07-20 ENCOUNTER — Other Ambulatory Visit: Payer: Self-pay | Admitting: Family Medicine

## 2021-07-20 DIAGNOSIS — Z1231 Encounter for screening mammogram for malignant neoplasm of breast: Secondary | ICD-10-CM

## 2021-07-31 ENCOUNTER — Ambulatory Visit
Admission: RE | Admit: 2021-07-31 | Discharge: 2021-07-31 | Disposition: A | Discharge status: Home / Self Care | Payer: No Typology Code available for payment source | Source: Ambulatory Visit | Attending: Family Medicine | Admitting: Family Medicine

## 2021-07-31 ENCOUNTER — Other Ambulatory Visit: Payer: Self-pay

## 2021-07-31 DIAGNOSIS — Z1231 Encounter for screening mammogram for malignant neoplasm of breast: Secondary | ICD-10-CM | POA: Diagnosis not present

## 2021-08-06 ENCOUNTER — Telehealth: Payer: Self-pay

## 2021-08-10 NOTE — Telephone Encounter (Signed)
Opened in error

## 2021-08-13 ENCOUNTER — Telehealth: Payer: Self-pay | Admitting: *Deleted

## 2021-08-19 ENCOUNTER — Ambulatory Visit: Payer: No Typology Code available for payment source | Attending: Oncology

## 2022-05-11 ENCOUNTER — Emergency Department
Admission: EM | Admit: 2022-05-11 | Discharge: 2022-05-11 | Disposition: A | Discharge status: Home / Self Care | Payer: PRIVATE HEALTH INSURANCE | Attending: Student in an Organized Health Care Education/Training Program | Admitting: Student in an Organized Health Care Education/Training Program

## 2022-05-11 ENCOUNTER — Encounter: Payer: Self-pay | Admitting: Emergency Medicine

## 2022-05-11 ENCOUNTER — Emergency Department: Payer: PRIVATE HEALTH INSURANCE

## 2022-05-11 ENCOUNTER — Other Ambulatory Visit: Payer: Self-pay

## 2022-05-11 DIAGNOSIS — Y92410 Unspecified street and highway as the place of occurrence of the external cause: Secondary | ICD-10-CM | POA: Diagnosis not present

## 2022-05-11 DIAGNOSIS — M545 Low back pain, unspecified: Secondary | ICD-10-CM | POA: Diagnosis present

## 2022-05-11 DIAGNOSIS — G8911 Acute pain due to trauma: Secondary | ICD-10-CM | POA: Diagnosis not present

## 2022-05-11 MED ORDER — CYCLOBENZAPRINE HCL 10 MG PO TABS
10.0000 mg | ORAL_TABLET | Freq: Three times a day (TID) | ORAL | 0 refills | Status: AC | PRN
Start: 2022-05-11 — End: ?
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
## 2022-05-11 NOTE — Discharge Instructions (Addendum)
FINDINGS: There are 5 non-rib-bearing lumbar vertebrae. There is no evidence of lumbar spine fracture. Unchanged posterior wedging of L5. No listhesis. There is mild-to-moderate lower lumbar predominant facet arthropathy, worst at L5-S1. pelvic phleboliths noted.   IMPRESSION: No evidence of lumbar spine fracture.
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
## 2022-05-13 ENCOUNTER — Emergency Department
Admission: EM | Admit: 2022-05-13 | Discharge: 2022-05-13 | Disposition: A | Discharge status: Home / Self Care | Payer: PRIVATE HEALTH INSURANCE | Attending: Emergency Medicine | Admitting: Emergency Medicine

## 2022-05-13 ENCOUNTER — Encounter: Payer: Self-pay | Admitting: Emergency Medicine

## 2022-05-13 ENCOUNTER — Other Ambulatory Visit: Payer: Self-pay

## 2022-05-13 DIAGNOSIS — M545 Low back pain, unspecified: Secondary | ICD-10-CM | POA: Diagnosis not present

## 2022-05-13 DIAGNOSIS — B9689 Other specified bacterial agents as the cause of diseases classified elsewhere: Secondary | ICD-10-CM | POA: Diagnosis not present

## 2022-05-13 DIAGNOSIS — M549 Dorsalgia, unspecified: Secondary | ICD-10-CM | POA: Diagnosis present

## 2022-05-13 DIAGNOSIS — N390 Urinary tract infection, site not specified: Secondary | ICD-10-CM | POA: Diagnosis not present

## 2022-05-13 LAB — URINALYSIS, ROUTINE W REFLEX MICROSCOPIC
Bacteria, UA: NONE SEEN
Bilirubin Urine: NEGATIVE
Glucose, UA: NEGATIVE mg/dL
Hgb urine dipstick: NEGATIVE
Ketones, ur: NEGATIVE mg/dL
Nitrite: NEGATIVE
Protein, ur: NEGATIVE mg/dL
Specific Gravity, Urine: 1.014 (ref 1.005–1.030)
pH: 6 (ref 5.0–8.0)

## 2022-05-13 MED ORDER — KETOROLAC TROMETHAMINE 30 MG/ML IJ SOLN
30.0000 mg | Freq: Once | INTRAMUSCULAR | Status: AC
  Administered 2022-05-13: 30 mg via INTRAMUSCULAR
  Filled 2022-05-13: qty 1

## 2022-05-13 MED ORDER — CIPROFLOXACIN HCL 500 MG PO TABS: 500.0000 mg | ORAL_TABLET | Freq: Two times a day (BID) | ORAL | 0 refills | Status: AC

## 2022-05-13 MED ORDER — NAPROXEN 500 MG PO TABS: 500.0000 mg | ORAL_TABLET | Freq: Two times a day (BID) | ORAL | 0 refills | Status: AC

## 2022-05-13 NOTE — Discharge Instructions (Signed)
Follow-up with your primary care provider at [facility] primary care.   Begin taking antibiotic twice a day for the next 7 days and increase fluids to stay hydrated. Naproxen 500 mg twice daily with food.  You may use ice or heat to your back as needed for discomfort.
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
## 2022-06-30 ENCOUNTER — Other Ambulatory Visit: Payer: Self-pay | Admitting: Family Medicine

## 2022-06-30 DIAGNOSIS — Z1231 Encounter for screening mammogram for malignant neoplasm of breast: Secondary | ICD-10-CM

## 2022-08-06 ENCOUNTER — Ambulatory Visit
Admission: RE | Admit: 2022-08-06 | Discharge: 2022-08-06 | Disposition: A | Discharge status: Home / Self Care | Payer: PRIVATE HEALTH INSURANCE | Source: Ambulatory Visit | Attending: Family Medicine | Admitting: Family Medicine

## 2022-08-06 DIAGNOSIS — Z1231 Encounter for screening mammogram for malignant neoplasm of breast: Secondary | ICD-10-CM | POA: Insufficient documentation

## 2023-07-27 ENCOUNTER — Other Ambulatory Visit: Payer: Self-pay | Admitting: Family Medicine

## 2023-07-27 ENCOUNTER — Encounter: Payer: Self-pay | Admitting: Family Medicine

## 2023-07-27 DIAGNOSIS — Z1231 Encounter for screening mammogram for malignant neoplasm of breast: Secondary | ICD-10-CM

## 2023-08-09 ENCOUNTER — Ambulatory Visit
Admission: RE | Admit: 2023-08-09 | Discharge: 2023-08-09 | Disposition: A | Discharge status: Home / Self Care | Payer: No Typology Code available for payment source | Source: Ambulatory Visit | Attending: Family Medicine | Admitting: Family Medicine

## 2023-08-09 DIAGNOSIS — Z1231 Encounter for screening mammogram for malignant neoplasm of breast: Secondary | ICD-10-CM | POA: Diagnosis present

## 2024-07-11 ENCOUNTER — Other Ambulatory Visit: Payer: Self-pay | Admitting: Family Medicine

## 2024-07-11 ENCOUNTER — Encounter: Payer: Self-pay | Admitting: Family Medicine

## 2024-07-11 DIAGNOSIS — Z1231 Encounter for screening mammogram for malignant neoplasm of breast: Secondary | ICD-10-CM

## 2024-08-09 ENCOUNTER — Ambulatory Visit
Admission: RE | Admit: 2024-08-09 | Discharge: 2024-08-09 | Disposition: A | Discharge status: Home / Self Care | Source: Ambulatory Visit | Attending: Family Medicine | Admitting: Family Medicine

## 2024-08-09 DIAGNOSIS — Z1231 Encounter for screening mammogram for malignant neoplasm of breast: Secondary | ICD-10-CM | POA: Insufficient documentation
